# Patient Record
Sex: Female | Born: 1950 | Hispanic: No | Smoking: Never smoker
Health system: Southern US, Community
[De-identification: ages and names within clinical notes are randomized; demographics above are authoritative.]

## PROBLEM LIST (undated history)

## (undated) DIAGNOSIS — Z972 Presence of dental prosthetic device (complete) (partial): Secondary | ICD-10-CM

## (undated) DIAGNOSIS — E785 Hyperlipidemia, unspecified: Secondary | ICD-10-CM

## (undated) DIAGNOSIS — Z6841 Body Mass Index (BMI) 40.0 and over, adult: Secondary | ICD-10-CM

## (undated) DIAGNOSIS — G5603 Carpal tunnel syndrome, bilateral upper limbs: Secondary | ICD-10-CM

## (undated) DIAGNOSIS — K219 Gastro-esophageal reflux disease without esophagitis: Secondary | ICD-10-CM

## (undated) DIAGNOSIS — M1711 Unilateral primary osteoarthritis, right knee: Secondary | ICD-10-CM

## (undated) DIAGNOSIS — M170 Bilateral primary osteoarthritis of knee: Secondary | ICD-10-CM

## (undated) DIAGNOSIS — I10 Essential (primary) hypertension: Secondary | ICD-10-CM

## (undated) HISTORY — PX: TUBAL LIGATION: SHX77

## (undated) HISTORY — PX: BILATERAL CARPAL TUNNEL RELEASE: SHX6508

## (undated) HISTORY — PX: APPENDECTOMY: SHX54

---

## 2008-10-17 ENCOUNTER — Encounter: Payer: Self-pay | Admitting: Family Medicine

## 2008-10-18 ENCOUNTER — Encounter: Payer: Self-pay | Admitting: Family Medicine

## 2009-01-30 ENCOUNTER — Encounter: Payer: Self-pay | Admitting: Family Medicine

## 2009-02-15 ENCOUNTER — Encounter: Payer: Self-pay | Admitting: Family Medicine

## 2009-03-18 ENCOUNTER — Encounter: Payer: Self-pay | Admitting: Family Medicine

## 2010-03-30 ENCOUNTER — Encounter: Payer: Self-pay | Admitting: Family Medicine

## 2010-04-17 ENCOUNTER — Encounter: Payer: Self-pay | Admitting: Family Medicine

## 2010-05-18 ENCOUNTER — Encounter: Payer: Self-pay | Admitting: Family Medicine

## 2013-03-15 ENCOUNTER — Emergency Department: Payer: Self-pay | Admitting: Emergency Medicine

## 2013-06-11 ENCOUNTER — Emergency Department: Payer: Self-pay | Admitting: Internal Medicine

## 2016-03-31 ENCOUNTER — Other Ambulatory Visit: Payer: Self-pay | Admitting: Family Medicine

## 2016-03-31 DIAGNOSIS — M25561 Pain in right knee: Principal | ICD-10-CM

## 2016-03-31 DIAGNOSIS — G8929 Other chronic pain: Secondary | ICD-10-CM

## 2016-04-22 ENCOUNTER — Ambulatory Visit: Payer: Medicare Other

## 2016-04-22 ENCOUNTER — Other Ambulatory Visit: Payer: Self-pay | Admitting: Family Medicine

## 2016-04-22 DIAGNOSIS — E2839 Other primary ovarian failure: Secondary | ICD-10-CM

## 2016-05-04 ENCOUNTER — Ambulatory Visit
Admission: RE | Admit: 2016-05-04 | Discharge: 2016-05-04 | Disposition: A | Discharge status: Home / Self Care | Payer: Medicare Other | Source: Ambulatory Visit | Attending: Family Medicine | Admitting: Family Medicine

## 2016-05-04 DIAGNOSIS — M25561 Pain in right knee: Secondary | ICD-10-CM | POA: Insufficient documentation

## 2016-05-04 DIAGNOSIS — G8929 Other chronic pain: Secondary | ICD-10-CM | POA: Insufficient documentation

## 2016-09-30 ENCOUNTER — Ambulatory Visit (INDEPENDENT_AMBULATORY_CARE_PROVIDER_SITE_OTHER): Payer: Medicare Other | Admitting: Vascular Surgery

## 2016-09-30 ENCOUNTER — Encounter (INDEPENDENT_AMBULATORY_CARE_PROVIDER_SITE_OTHER): Payer: Self-pay | Admitting: Vascular Surgery

## 2016-09-30 DIAGNOSIS — M79604 Pain in right leg: Secondary | ICD-10-CM

## 2016-09-30 DIAGNOSIS — I83811 Varicose veins of right lower extremities with pain: Secondary | ICD-10-CM | POA: Diagnosis not present

## 2016-09-30 DIAGNOSIS — M79609 Pain in unspecified limb: Secondary | ICD-10-CM | POA: Insufficient documentation

## 2016-09-30 DIAGNOSIS — I83813 Varicose veins of bilateral lower extremities with pain: Secondary | ICD-10-CM

## 2016-09-30 DIAGNOSIS — M79605 Pain in left leg: Secondary | ICD-10-CM

## 2016-09-30 DIAGNOSIS — I872 Venous insufficiency (chronic) (peripheral): Secondary | ICD-10-CM

## 2016-09-30 NOTE — Progress Notes (Signed)
    MRN : [id number]  Rose Roberts is a 65 y.o. (06/23/1951) female who presents with chief complaint of  Chief Complaint  Patient presents with  . Varicose Veins    Right Leg laser ablation  .    The patient's right lower extremity was sterilely prepped and draped.  The ultrasound machine was used to visualize the right great saphenous vein throughout its course.  A segment of the great saphenous vein below the knee was selected for access.  The saphenous vein was accessed without difficulty using ultrasound guidance with a micropuncture needle.   An 0.018  wire was placed beyond the saphenofemoral junction through the sheath and the microneedle was removed.  The 65 cm sheath was then placed over the wire and the wire and dilator were removed.  The laser fiber was placed through the sheath and its tip was placed approximately 2 cm below the saphenofemoral junction.  Tumescent anesthesia was then created with a dilute lidocaine solution.  Laser energy was then delivered with constant withdrawal of the sheath and laser fiber.  Approximately 1274 Joules of energy were delivered over a length of 47 cm.  Sterile dressings were placed.  The patient tolerated the procedure well without complications.

## 2016-10-04 ENCOUNTER — Ambulatory Visit (INDEPENDENT_AMBULATORY_CARE_PROVIDER_SITE_OTHER): Payer: Medicare Other

## 2016-10-04 DIAGNOSIS — I83813 Varicose veins of bilateral lower extremities with pain: Secondary | ICD-10-CM

## 2016-10-25 ENCOUNTER — Encounter (INDEPENDENT_AMBULATORY_CARE_PROVIDER_SITE_OTHER): Payer: Self-pay | Admitting: Vascular Surgery

## 2016-10-25 ENCOUNTER — Ambulatory Visit (INDEPENDENT_AMBULATORY_CARE_PROVIDER_SITE_OTHER): Payer: Medicare Other | Admitting: Vascular Surgery

## 2016-10-25 VITALS — BP 157/79 | HR 80 | Resp 16 | Wt 186.0 lb

## 2016-10-25 DIAGNOSIS — I872 Venous insufficiency (chronic) (peripheral): Secondary | ICD-10-CM | POA: Diagnosis not present

## 2016-10-25 DIAGNOSIS — M79605 Pain in left leg: Secondary | ICD-10-CM | POA: Diagnosis not present

## 2016-10-25 DIAGNOSIS — M79604 Pain in right leg: Secondary | ICD-10-CM | POA: Diagnosis not present

## 2016-10-25 DIAGNOSIS — I83813 Varicose veins of bilateral lower extremities with pain: Secondary | ICD-10-CM

## 2016-10-25 NOTE — Progress Notes (Signed)
MRN : [id number]  Alvy BimlerBerta Defeo is a 66 y.o. (04/22/1951) female who presents with chief complaint of  Chief Complaint  Patient presents with  . Follow-up  .  History of Present Illness: The patient returns to the office for followup status post laser ablation of the Right great saphenous vein on 09/30/2016.  The patient note significant improvement in the lower extremity pain but not resolution of the symptoms. The patient notes multiple residual varicosities bilaterally which continued to hurt with dependent positions and remained tender to palpation. The patient's swelling is minimally from preoperative status. The patient continues to wear graduated compression stockings on a daily basis but these are not eliminating the pain and discomfort. The patient continues to use over-the-counter anti-inflammatory medications to treat the pain and related symptoms but this has not given the patient relief. The patient notes the pain in the lower extremities is causing problems with daily exercise, problems at work and even with household activities such as preparing meals and doing dishes.  The patient is otherwise done well and there have been no complications related to the laser procedure or interval changes in the patient's overall   Post laser ultrasound shows successful ablation of the right great saphenous vein. Ultrasound was dated 10/04/2016    Current Meds  Medication Sig  . hydrochlorothiazide (MICROZIDE) 12.5 MG capsule   . meloxicam (MOBIC) 15 MG tablet   . triamcinolone cream (KENALOG) 0.1 %     No past medical history on file.  No past surgical history on file.  Social History Social History  Substance Use Topics  . Smoking status: Never Smoker  . Smokeless tobacco: Never Used  . Alcohol use No    Family History No family history on file. No family history of bleeding/clotting disorders, porphyria or autoimmune disease   No Known Allergies   REVIEW OF SYSTEMS  (Negative unless checked)  Constitutional: [] Weight loss  [] Fever  [] Chills Cardiac: [] Chest pain   [] Chest pressure   [] Palpitations   [] Shortness of breath when laying flat   [] Shortness of breath with exertion. Vascular:  [] Pain in legs with walking   [x] Pain in legs with standing  [] History of DVT   [] Phlebitis   [x] Swelling in legs   [x] Varicose veins   [] Non-healing ulcers Pulmonary:   [] Uses home oxygen   [] Productive cough   [] Hemoptysis   [] Wheeze  [] COPD   [] Asthma Neurologic:  [] Dizziness   [] Seizures   [] History of stroke   [] History of TIA  [] Aphasia   [] Vissual changes   [] Weakness or numbness in arm   [] Weakness or numbness in leg Musculoskeletal:   [] Joint swelling   [] Joint pain   [] Low back pain Hematologic:  [] Easy bruising  [] Easy bleeding   [] Hypercoagulable state   [] Anemic Gastrointestinal:  [] Diarrhea   [] Vomiting  [] Gastroesophageal reflux/heartburn   [] Difficulty swallowing. Genitourinary:  [] Chronic kidney disease   [] Difficult urination  [] Frequent urination   [] Blood in urine Skin:  [] Rashes   [] Ulcers  Psychological:  [] History of anxiety   []  History of major depression.  Physical Examination  Vitals:   10/25/16 0908  BP: (!) 157/79  Pulse: 80  Resp: 16  Weight: 186 lb (84.4 kg)   Body mass index is 34.02 kg/m. Gen: WD/WN, NAD Head: [place]/AT, No temporalis wasting.  Ear/Nose/Throat: Hearing grossly intact, nares w/o erythema or drainage, poor dentition Eyes: PER, EOMI, sclera nonicteric.  Neck: Supple, no masses.  No bruit or JVD.  Pulmonary:  Good air  movement, clear to auscultation bilaterally, no use of accessory muscles.  Cardiac: RRR, normal S1, S2, no Murmurs. Vascular: Right leg with multiple diffuse large varicosities, varicose veins are proximally 7-10 mm in diameter. They are tender to palpation. There is 2+ edema bilaterally. There is mild to moderate venous stasis skin changes bilaterally right greater than left Vessel Right Left  Radial  Palpable Palpable  Ulnar Palpable Palpable  Brachial Palpable Palpable  Carotid Palpable Palpable  Femoral Palpable Palpable  Popliteal Palpable Palpable  PT Palpable Palpable  DP Palpable Palpable   Gastrointestinal: soft, non-distended. No guarding/no peritoneal signs.  Musculoskeletal: M/S 5/5 throughout.  No deformity or atrophy.  Neurologic: CN 2-12 intact. Pain and light touch intact in extremities.  Symmetrical.  Speech is fluent. Motor exam as listed above. Psychiatric: Judgment intact, Mood & affect appropriate for pt's clinical situation. Dermatologic: No rashes or ulcers noted.  No changes consistent with cellulitis. Lymph : No Cervical lymphadenopathy, no lichenification or skin changes of chronic lymphedema.  CBC No results found for: WBC, HGB, HCT, MCV, PLT  BMET No results found for: NA, K, CL, CO2, GLUCOSE, BUN, CREATININE, CALCIUM, GFRNONAA, GFRAA CrCl cannot be calculated (No order found.).  COAG No results found for: INR, PROTIME  Radiology No results found.   Assessment/Plan 1. Varicose veins of bilateral lower extremities with pain Recommend:  The patient has had successful ablation of the previously incompetent saphenous venous system but still has persistent symptoms of pain and swelling that are having a negative impact on daily life and daily activities.  Patient should undergo injection sclerotherapy to treat the residual varicosities.  The risks, benefits and alternative therapies were reviewed in detail with the patient.  All questions were answered.  The patient agrees to proceed with sclerotherapy at their convenience.  The patient will continue wearing the graduated compression stockings and using the over-the-counter pain medications to treat her symptoms.   2. Chronic venous insufficiency No surgery or intervention at this point in time.    I have had a long discussion with the patient regarding venous insufficiency and why it  causes  symptoms. I have discussed with the patient the chronic skin changes that accompany venous insufficiency and the long term sequela such as infection and ulceration.  Patient will begin wearing graduated compression stockings class 1 (20-30 mmHg) or compression wraps on a daily basis a prescription was given. The patient will put the stockings on first thing in the morning and removing them in the evening. The patient is instructed specifically not to sleep in the stockings.    In addition, behavioral modification including several periods of elevation of the lower extremities during the day will be continued. I have demonstrated that proper elevation is a position with the ankles at heart level.  The patient is instructed to begin routine exercise, especially walking on a daily basis  Following the review of the ultrasound the patient will follow up in 2-3 months to reassess the degree of swelling and the control that graduated compression stockings or compression wraps  is offering.     3. Pain in both lower extremities See  #1 and #2    Levora Dredge, MD  10/25/2016 12:02 PM

## 2016-11-18 ENCOUNTER — Encounter (INDEPENDENT_AMBULATORY_CARE_PROVIDER_SITE_OTHER): Payer: Self-pay | Admitting: Vascular Surgery

## 2016-11-18 ENCOUNTER — Ambulatory Visit (INDEPENDENT_AMBULATORY_CARE_PROVIDER_SITE_OTHER): Payer: Medicare Other | Admitting: Vascular Surgery

## 2016-11-18 VITALS — BP 159/88 | HR 72 | Resp 16 | Ht 62.0 in | Wt 184.0 lb

## 2016-11-18 DIAGNOSIS — I83813 Varicose veins of bilateral lower extremities with pain: Secondary | ICD-10-CM

## 2016-11-18 DIAGNOSIS — I83812 Varicose veins of left lower extremities with pain: Secondary | ICD-10-CM | POA: Diagnosis not present

## 2016-11-18 DIAGNOSIS — I872 Venous insufficiency (chronic) (peripheral): Secondary | ICD-10-CM

## 2016-11-18 DIAGNOSIS — M79605 Pain in left leg: Secondary | ICD-10-CM

## 2016-11-18 DIAGNOSIS — M79604 Pain in right leg: Secondary | ICD-10-CM

## 2016-11-18 NOTE — Progress Notes (Signed)
    MRN : [id number]  Rose Roberts is a 66 y.o. (07/18/1951) female who presents with chief complaint of  Chief Complaint  Patient presents with  . Varicose Veins    Left leg GSV laser ablation  .    The patient's left lower extremity was sterilely prepped and draped.  The ultrasound machine was used to visualize the great saphenous vein throughout its course.  A portion of the great saphenous vein in the mid thigh was selected for access.  The saphenous vein was accessed without difficulty using ultrasound guidance with a micropuncture needle.   An 0.018  wire was placed beyond the saphenofemoral junction through the sheath and the microneedle was removed.  The 65 cm sheath was then placed over the wire and the wire and dilator were removed.  The laser fiber was placed through the sheath and its tip was placed approximately 2 cm below the saphenofemoral junction.  Tumescent anesthesia was then created with a dilute lidocaine solution.  Laser energy was then delivered with constant withdrawal of the sheath and laser fiber.  Approximately 800 Joules of energy were delivered over a length of 18 cm.    The patient's leg was then repositioned. The anterior saphenous vein was then evaluated with ultrasound.   A segment in the mid thigh was selected for access.  The saphenous vein was accessed without difficulty using ultrasound guidance with a micropuncture needle.   An 0.018  wire was placed beyond the saphenofemoral junction through the sheath and the microneedle was removed.  The 65 cm sheath was then placed over the wire and the wire and dilator were removed.  The laser fiber was placed through the sheath and its tip was placed approximately 2 cm below the saphenofemoral junction.  Tumescent anesthesia was then created with a dilute lidocaine solution.  Laser energy was then delivered with constant withdrawal of the sheath and laser fiber.  Approximately 640 Joules of energy were delivered over a length  of 16 cm.    Sterile dressings were placed.  The patient tolerated the procedure well without complications.

## 2016-11-22 ENCOUNTER — Ambulatory Visit (INDEPENDENT_AMBULATORY_CARE_PROVIDER_SITE_OTHER): Payer: Medicare Other

## 2016-11-22 ENCOUNTER — Encounter (INDEPENDENT_AMBULATORY_CARE_PROVIDER_SITE_OTHER): Payer: Self-pay | Admitting: Vascular Surgery

## 2016-11-22 ENCOUNTER — Other Ambulatory Visit (INDEPENDENT_AMBULATORY_CARE_PROVIDER_SITE_OTHER): Payer: Self-pay | Admitting: Vascular Surgery

## 2016-11-22 ENCOUNTER — Ambulatory Visit (INDEPENDENT_AMBULATORY_CARE_PROVIDER_SITE_OTHER): Payer: Medicare Other | Admitting: Vascular Surgery

## 2016-11-22 VITALS — BP 148/80 | HR 98 | Resp 16 | Wt 183.0 lb

## 2016-11-22 DIAGNOSIS — I83892 Varicose veins of left lower extremities with other complications: Secondary | ICD-10-CM

## 2016-11-22 DIAGNOSIS — I83813 Varicose veins of bilateral lower extremities with pain: Secondary | ICD-10-CM

## 2016-11-22 DIAGNOSIS — I872 Venous insufficiency (chronic) (peripheral): Secondary | ICD-10-CM | POA: Diagnosis not present

## 2016-11-22 DIAGNOSIS — I82412 Acute embolism and thrombosis of left femoral vein: Secondary | ICD-10-CM | POA: Diagnosis not present

## 2016-11-22 DIAGNOSIS — I82409 Acute embolism and thrombosis of unspecified deep veins of unspecified lower extremity: Secondary | ICD-10-CM | POA: Insufficient documentation

## 2016-11-22 NOTE — Progress Notes (Signed)
MRN : [id number]  Rose BimlerBerta Roberts is a 66 y.o. (01/28/1951) female who presents with chief complaint of  Chief Complaint  Patient presents with  . Follow-up  .  History of Present Illness: The patient returns to the office for followup status post laser ablation of the left great saphenous vein on 11/18/2016.  The patient note significant improvement in the lower extremity pain but not resolution of the symptoms. The patient notes multiple residual varicosities bilaterally which continued to hurt with dependent positions and remained tender to palpation. The patient's swelling is minimally from preoperative status. The patient continues to wear graduated compression stockings on a daily basis but these are not eliminating the pain and discomfort. The patient continues to use over-the-counter anti-inflammatory medications to treat the pain and related symptoms but this has not given the patient relief. The patient notes the pain in the lower extremities is causing problems with daily exercise, problems at work and even with household activities such as preparing meals and doing dishes.  The patient is otherwise done well and there have been no complications related to the laser procedure or interval changes in the patient's overall   Post laser ultrasound shows successful ablation of the left great saphenous vein also there is thrombus extending into the common femoral artery   Current Meds  Medication Sig  . hydrochlorothiazide (MICROZIDE) 12.5 MG capsule   . meloxicam (MOBIC) 15 MG tablet   . triamcinolone cream (KENALOG) 0.1 %     No past medical history on file.  No past surgical history on file.  Social History Social History  Substance Use Topics  . Smoking status: Never Smoker  . Smokeless tobacco: Never Used  . Alcohol use No    Family History No family history on file. No family history of bleeding/clotting disorders, porphyria or autoimmune disease   No Known  Allergies   REVIEW OF SYSTEMS (Negative unless checked)  Constitutional: [] Weight loss  [] Fever  [] Chills Cardiac: [] Chest pain   [] Chest pressure   [] Palpitations   [] Shortness of breath when laying flat   [] Shortness of breath with exertion. Vascular:  [] Pain in legs with walking   [] Pain in legs at rest  [x]  DVT   [] Phlebitis   [x] Swelling in legs   [x] Varicose veins   [] Non-healing ulcers Pulmonary:   [] Uses home oxygen   [] Productive cough   [] Hemoptysis   [] Wheeze  [] COPD   [] Asthma Neurologic:  [] Dizziness   [] Seizures   [] History of stroke   [] History of TIA  [] Aphasia   [] Vissual changes   [] Weakness or numbness in arm   [] Weakness or numbness in leg Musculoskeletal:   [] Joint swelling   [] Joint pain   [] Low back pain Hematologic:  [] Easy bruising  [] Easy bleeding   [] Hypercoagulable state   [] Anemic Gastrointestinal:  [] Diarrhea   [] Vomiting  [] Gastroesophageal reflux/heartburn   [] Difficulty swallowing. Genitourinary:  [] Chronic kidney disease   [] Difficult urination  [] Frequent urination   [] Blood in urine Skin:  [] Rashes   [] Ulcers  Psychological:  [] History of anxiety   []  History of major depression.  Physical Examination  Vitals:   11/22/16 1158  BP: (!) 148/80  Pulse: 98  Resp: 16  Weight: 183 lb (83 kg)   Body mass index is 33.47 kg/m. Gen: WD/WN, NAD Head: /AT, No temporalis wasting.  Ear/Nose/Throat: Hearing grossly intact, nares w/o erythema or drainage, poor dentition Eyes: PER, EOMI, sclera nonicteric.  Neck: Supple, no masses.  No bruit or JVD.  Pulmonary:  Good air movement, clear to auscultation bilaterally, no use of accessory muscles.  Cardiac: RRR, normal S1, S2, no Murmurs. Vascular:  Large varicosities of the left leg minimal swelling Vessel Right Left  Radial Palpable Palpable  Ulnar Palpable Palpable  Brachial Palpable Palpable  Carotid Palpable Palpable  Femoral Palpable Palpable  Popliteal Palpable Palpable  PT Palpable Palpable  DP  Palpable Palpable   Gastrointestinal: soft, non-distended. No guarding/no peritoneal signs.  Musculoskeletal: M/S 5/5 throughout.  No deformity or atrophy.  Neurologic: CN 2-12 intact. Pain and light touch intact in extremities.  Symmetrical.  Speech is fluent. Motor exam as listed above. Psychiatric: Judgment intact, Mood & affect appropriate for pt's clinical situation. Dermatologic: No rashes or ulcers noted.  No changes consistent with cellulitis. Lymph : No Cervical lymphadenopathy, no lichenification or skin changes of chronic lymphedema.  CBC No results found for: WBC, HGB, HCT, MCV, PLT  BMET No results found for: NA, K, CL, CO2, GLUCOSE, BUN, CREATININE, CALCIUM, GFRNONAA, GFRAA CrCl cannot be calculated (No order found.).  COAG No results found for: INR, PROTIME  Radiology No results found.  Assessment/Plan 1. Acute deep vein thrombosis (DVT) of femoral vein of left lower extremity (HCC) Will start Eliquis and see back in 3 weeks with a duplex ultraosund - VAS [place] LOWER EXTREMITY VENOUS (DVT); Future  2. Chronic venous insufficiency No surgery or intervention at this point in time.    I have had a long discussion with the patient regarding venous insufficiency and why it  causes symptoms. I have discussed with the patient the chronic skin changes that accompany venous insufficiency and the long term sequela such as infection and ulceration.  Patient will begin wearing graduated compression stockings class 1 (20-30 mmHg) or compression wraps on a daily basis a prescription was given. The patient will put the stockings on first thing in the morning and removing them in the evening. The patient is instructed specifically not to sleep in the stockings.    In addition, behavioral modification including several periods of elevation of the lower extremities during the day will be continued. I have demonstrated that proper elevation is a position with the ankles at heart  level.  The patient is instructed to begin routine exercise, especially walking on a daily basis  Following the review of the ultrasound the patient will follow up in 2-3 weeks to reassess the degree of swelling and the control that graduated compression stockings or compression wraps  is offering.   The patient can be assessed for a Lymph Pump at that time  3. Varicose veins of bilateral lower extremities with pain Recommend:  The patient has had successful ablation of the previously incompetent saphenous venous system but still has persistent symptoms of pain and swelling that are having a negative impact on daily life and daily activities.  Patient should undergo injection sclerotherapy to treat the residual varicosities.  The risks, benefits and alternative therapies were reviewed in detail with the patient.  All questions were answered.  The patient agrees to proceed with sclerotherapy at their convenience.  The patient will continue wearing the graduated compression stockings and using the over-the-counter pain medications to treat her symptoms.     Levora Dredge, MD  11/22/2016 12:24 PM

## 2016-12-13 ENCOUNTER — Encounter (INDEPENDENT_AMBULATORY_CARE_PROVIDER_SITE_OTHER): Payer: Medicare Other

## 2016-12-13 ENCOUNTER — Ambulatory Visit (INDEPENDENT_AMBULATORY_CARE_PROVIDER_SITE_OTHER): Payer: Medicare Other | Admitting: Vascular Surgery

## 2016-12-20 ENCOUNTER — Other Ambulatory Visit (INDEPENDENT_AMBULATORY_CARE_PROVIDER_SITE_OTHER): Payer: Self-pay | Admitting: Vascular Surgery

## 2016-12-20 ENCOUNTER — Encounter (INDEPENDENT_AMBULATORY_CARE_PROVIDER_SITE_OTHER): Payer: Self-pay | Admitting: Vascular Surgery

## 2016-12-20 ENCOUNTER — Ambulatory Visit (INDEPENDENT_AMBULATORY_CARE_PROVIDER_SITE_OTHER): Payer: Medicare Other

## 2016-12-20 ENCOUNTER — Ambulatory Visit (INDEPENDENT_AMBULATORY_CARE_PROVIDER_SITE_OTHER): Payer: Medicare Other | Admitting: Vascular Surgery

## 2016-12-20 VITALS — BP 151/82 | HR 74 | Resp 16 | Wt 187.0 lb

## 2016-12-20 DIAGNOSIS — M79604 Pain in right leg: Secondary | ICD-10-CM | POA: Diagnosis not present

## 2016-12-20 DIAGNOSIS — I82412 Acute embolism and thrombosis of left femoral vein: Secondary | ICD-10-CM | POA: Diagnosis not present

## 2016-12-20 DIAGNOSIS — I83813 Varicose veins of bilateral lower extremities with pain: Secondary | ICD-10-CM

## 2016-12-20 DIAGNOSIS — M79605 Pain in left leg: Secondary | ICD-10-CM | POA: Diagnosis not present

## 2016-12-20 DIAGNOSIS — I872 Venous insufficiency (chronic) (peripheral): Secondary | ICD-10-CM

## 2016-12-21 NOTE — Progress Notes (Signed)
MRN : [id number]  Rose Roberts is a 66 y.o. (July 30, 1951) female who presents with chief complaint of  Chief Complaint  Patient presents with  . Follow-up  .  History of Present Illness: The patient returns to the office for followup status post laser ablation of the left saphenous vein.  The patient note significant improvement in the lower extremity pain but not resolution of the symptoms. The patient notes multiple residual varicosities bilaterally which continued to hurt with dependent positions and remained tender to palpation. The patient's swelling is minimally from preoperative status. The patient continues to wear graduated compression stockings on a daily basis but these are not eliminating the pain and discomfort. The patient continues to use over-the-counter anti-inflammatory medications to treat the pain and related symptoms but this has not given the patient relief. The patient notes the pain in the lower extremities is causing problems with daily exercise, problems at work and even with household activities such as preparing meals and doing dishes.  The patient is otherwise done well and there have been no complications related to the laser procedure or interval changes in the patient's overall   Post laser ultrasound shows successful ablation of the left great saphenous vein   Current Meds  Medication Sig  . hydrochlorothiazide (MICROZIDE) 12.5 MG capsule   . triamcinolone cream (KENALOG) 0.1 %     No past medical history on file.  No past surgical history on file.  Social History Social History  Substance Use Topics  . Smoking status: Never Smoker  . Smokeless tobacco: Never Used  . Alcohol use No    Family History No family history on file. No family history of bleeding/clotting disorders, porphyria or autoimmune disease  No Known Allergies   REVIEW OF SYSTEMS (Negative unless checked)  Constitutional: [] Weight loss  [] Fever  [] Chills Cardiac: [] Chest  pain   [] Chest pressure   [] Palpitations   [] Shortness of breath when laying flat   [] Shortness of breath with exertion. Vascular:  [] Pain in legs with walking   [x] Pain in legs with standing  [] History of DVT   [] Phlebitis   [x] Swelling in legs   [x] Varicose veins   [] Non-healing ulcers Pulmonary:   [] Uses home oxygen   [] Productive cough   [] Hemoptysis   [] Wheeze  [] COPD   [] Asthma Neurologic:  [] Dizziness   [] Seizures   [] History of stroke   [] History of TIA  [] Aphasia   [] Vissual changes   [] Weakness or numbness in arm   [] Weakness or numbness in leg Musculoskeletal:   [] Joint swelling   [] Joint pain   [] Low back pain Hematologic:  [] Easy bruising  [] Easy bleeding   [] Hypercoagulable state   [] Anemic Gastrointestinal:  [] Diarrhea   [] Vomiting  [] Gastroesophageal reflux/heartburn   [] Difficulty swallowing. Genitourinary:  [] Chronic kidney disease   [] Difficult urination  [] Frequent urination   [] Blood in urine Skin:  [] Rashes   [] Ulcers  Psychological:  [] History of anxiety   []  History of major depression.  Physical Examination  Vitals:   12/20/16 0926  BP: (!) 151/82  Pulse: 74  Resp: 16  Weight: 84.8 kg (187 lb)   Body mass index is 34.2 kg/m. Gen: WD/WN, NAD Head: [place]/AT, No temporalis wasting.  Ear/Nose/Throat: Hearing grossly intact, nares w/o erythema or drainage, poor dentition Eyes: PER, EOMI, sclera nonicteric.  Neck: Supple, no masses.  No bruit or JVD.  Pulmonary:  Good air movement, clear to auscultation bilaterally, no use of accessory muscles.  Cardiac: RRR, normal S1, S2, no  Murmurs. Vascular:  Large varicose veins left leg tender to palpation >10 mm, moderate venous stasis changes Vessel Right Left  Radial Palpable Palpable  Ulnar Palpable Palpable  Brachial Palpable Palpable  Carotid Palpable Palpable  Femoral Palpable Palpable  Popliteal Palpable Palpable  PT Palpable Palpable  DP Palpable Palpable  Gastrointestinal: soft, non-distended. No guarding/no  peritoneal signs.  Musculoskeletal: M/S 5/5 throughout.  No deformity or atrophy.  Neurologic: CN 2-12 intact. Pain and light touch intact in extremities.  Symmetrical.  Speech is fluent. Motor exam as listed above. Psychiatric: Judgment intact, Mood & affect appropriate for pt's clinical situation. Dermatologic: No rashes or ulcers noted.  No changes consistent with cellulitis. Lymph : No Cervical lymphadenopathy, no lichenification or skin changes of chronic lymphedema.  CBC No results found for: WBC, HGB, HCT, MCV, PLT  BMET No results found for: NA, K, CL, CO2, GLUCOSE, BUN, CREATININE, CALCIUM, GFRNONAA, GFRAA CrCl cannot be calculated (No order found.).  COAG No results found for: INR, PROTIME  Radiology No results found.  Assessment/Plan 1. Varicose veins of bilateral lower extremities with pain Recommend:  The patient has had successful ablation of the previously incompetent saphenous venous system but still has persistent symptoms of pain and swelling that are having a negative impact on daily life and daily activities.  Patient should undergo injection sclerotherapy to treat the residual varicosities.  The risks, benefits and alternative therapies were reviewed in detail with the patient.  All questions were answered.  The patient agrees to proceed with sclerotherapy at their convenience.  The patient will continue wearing the graduated compression stockings and using the over-the-counter pain medications to treat her symptoms.     2. Acute deep vein thrombosis (DVT) of femoral vein of left lower extremity (HCC) No evidence of DVT on the study dated 12/20/2016  DC Eliquis  3. Chronic venous insufficiency See #1  4. Pain in both lower extremities Will move forward with sclerotherapy    Levora DredgeGregory Meridian Scherger, MD  12/21/2016 8:18 AM

## 2017-03-08 ENCOUNTER — Other Ambulatory Visit: Payer: Self-pay | Admitting: Family Medicine

## 2017-03-10 ENCOUNTER — Ambulatory Visit (INDEPENDENT_AMBULATORY_CARE_PROVIDER_SITE_OTHER): Payer: Medicare Other | Admitting: Vascular Surgery

## 2017-03-10 ENCOUNTER — Encounter (INDEPENDENT_AMBULATORY_CARE_PROVIDER_SITE_OTHER): Payer: Self-pay | Admitting: Vascular Surgery

## 2017-03-10 VITALS — BP 158/91 | HR 94 | Resp 15 | Ht 63.5 in | Wt 186.0 lb

## 2017-03-10 DIAGNOSIS — I83813 Varicose veins of bilateral lower extremities with pain: Secondary | ICD-10-CM

## 2017-03-10 DIAGNOSIS — M79605 Pain in left leg: Secondary | ICD-10-CM | POA: Diagnosis not present

## 2017-03-10 DIAGNOSIS — M79604 Pain in right leg: Secondary | ICD-10-CM | POA: Diagnosis not present

## 2017-03-10 DIAGNOSIS — I872 Venous insufficiency (chronic) (peripheral): Secondary | ICD-10-CM | POA: Diagnosis not present

## 2017-03-10 NOTE — Progress Notes (Signed)
MRN : [id number]  Rose Roberts is a 66 y.o. (12/08/1950) female who presents with chief complaint of  Chief Complaint  Patient presents with  . Re-evaluation    Post laser follow up  .  History of Present Illness: The patient returns to the office for followup status post laser ablation of the Rt and Lt GSV saphenous vein on 09/30/2016 and 11/18/2016 respectively.  The patient note significant improvement in the lower extremity pain but not resolution of the symptoms. The patient notes multiple residual varicosities bilaterally which continued to hurt with dependent positions and remained tender to palpation. The patient's swelling is minimally from preoperative status. The patient continues to wear graduated compression stockings on a daily basis but these are not eliminating the pain and discomfort. The patient continues to use over-the-counter anti-inflammatory medications to treat the pain and related symptoms but this has not given the patient relief. The patient notes the pain in the lower extremities is causing problems with daily exercise, problems at work and even with household activities such as preparing meals and doing dishes.  The patient is otherwise done well and there have been no complications related to the laser procedure or interval changes in the patient's overall   Post laser ultrasound shows successful ablation of the bilateral GSV with resolution of the post laser DVT   Current Meds  Medication Sig  . ALPRAZolam (XANAX) 0.5 MG tablet   . atorvastatin (LIPITOR) 20 MG tablet   . hydrochlorothiazide (MICROZIDE) 12.5 MG capsule   . meloxicam (MOBIC) 15 MG tablet   . triamcinolone cream (KENALOG) 0.1 %     No past medical history on file.  No past surgical history on file.  Social History Social History  Substance Use Topics  . Smoking status: Never Smoker  . Smokeless tobacco: Never Used  . Alcohol use No    Family History No family history on file.  No  Known Allergies   REVIEW OF SYSTEMS (Negative unless checked)  Constitutional: [] Weight loss  [] Fever  [] Chills Cardiac: [] Chest pain   [] Chest pressure   [] Palpitations   [] Shortness of breath when laying flat   [] Shortness of breath with exertion. Vascular:  [] Pain in legs with walking   [x] Pain in legs with standing  [] History of DVT   [] Phlebitis   [x] Swelling in legs   [x] Varicose veins   [] Non-healing ulcers Pulmonary:   [] Uses home oxygen   [] Productive cough   [] Hemoptysis   [] Wheeze  [] COPD   [] Asthma Neurologic:  [] Dizziness   [] Seizures   [] History of stroke   [] History of TIA  [] Aphasia   [] Vissual changes   [] Weakness or numbness in arm   [] Weakness or numbness in leg Musculoskeletal:   [] Joint swelling   [] Joint pain   [] Low back pain Hematologic:  [] Easy bruising  [] Easy bleeding   [] Hypercoagulable state   [] Anemic Gastrointestinal:  [] Diarrhea   [] Vomiting  [] Gastroesophageal reflux/heartburn   [] Difficulty swallowing. Genitourinary:  [] Chronic kidney disease   [] Difficult urination  [] Frequent urination   [] Blood in urine Skin:  [] Rashes   [] Ulcers  Psychological:  [] History of anxiety   []  History of major depression.  Physical Examination  Vitals:   03/10/17 1554  BP: (!) 158/91  Pulse: 94  Resp: 15  Weight: 186 lb (84.4 kg)  Height: 5' 3.5" (1.613 m)   Body mass index is 32.43 kg/m. Gen: WD/WN, NAD Head: [place]/AT, No temporalis wasting.  Ear/Nose/Throat: Hearing grossly intact, nares w/o erythema or  drainage Eyes: PER, EOMI, sclera nonicteric.  Neck: Supple, no large masses.   Pulmonary:  Good air movement, no audible wheezing bilaterally, no use of accessory muscles.  Cardiac: RRR, no JVD Vascular: Large varicosities present extensively greater than 10 mm bilaterally.  Mild venous stasis changes to the legs bilaterally.  2+ soft pitting edema Vessel Right Left  PT Palpable Palpable  DP Palpable Palpable  Gastrointestinal: Non-distended. No guarding/no  peritoneal signs.  Musculoskeletal: M/S 5/5 throughout.  No deformity or atrophy.  Neurologic: CN 2-12 intact. Symmetrical.  Speech is fluent. Motor exam as listed above. Psychiatric: Judgment intact, Mood & affect appropriate for pt's clinical situation. Dermatologic: No rashes or ulcers noted.  No changes consistent with cellulitis. Lymph : No lichenification or skin changes of chronic lymphedema.  CBC No results found for: WBC, HGB, HCT, MCV, PLT  BMET No results found for: NA, K, CL, CO2, GLUCOSE, BUN, CREATININE, CALCIUM, GFRNONAA, GFRAA CrCl cannot be calculated (No order found.).  COAG No results found for: INR, PROTIME  Radiology No results found.  Assessment/Plan 1. Varicose veins of bilateral lower extremities with pain Recommend:  The patient has had successful ablation of the previously incompetent saphenous venous system but still has persistent symptoms of pain and swelling that are having a negative impact on daily life and daily activities.  Patient should undergo injection sclerotherapy to treat the residual varicosities.  The risks, benefits and alternative therapies were reviewed in detail with the patient.  All questions were answered.  The patient agrees to proceed with sclerotherapy at their convenience.  The patient will continue wearing the graduated compression stockings and using the over-the-counter pain medications to treat her symptoms.    2. Chronic venous insufficiency No surgery or intervention at this point in time.    I have had a long discussion with the patient regarding venous insufficiency and why it  causes symptoms. I have discussed with the patient the chronic skin changes that accompany venous insufficiency and the long term sequela such as infection and ulceration.  Patient will begin wearing graduated compression stockings class 1 (20-30 mmHg) or compression wraps on a daily basis a prescription was given. The patient will put the  stockings on first thing in the morning and removing them in the evening. The patient is instructed specifically not to sleep in the stockings.    In addition, behavioral modification including several periods of elevation of the lower extremities during the day will be continued. I have demonstrated that proper elevation is a position with the ankles at heart level.  The patient is instructed to begin routine exercise, especially walking on a daily basis  Following the review of the ultrasound the patient will follow up in 2-3 months to reassess the degree of swelling and the control that graduated compression stockings or compression wraps  is offering.   T   3. Pain in both lower extremities See #1&2   Levora DredgeGregory Kanisha Duba, MD  03/10/2017 4:25 PM

## 2017-04-18 ENCOUNTER — Ambulatory Visit (INDEPENDENT_AMBULATORY_CARE_PROVIDER_SITE_OTHER): Payer: Medicare Other | Admitting: Vascular Surgery

## 2017-04-18 ENCOUNTER — Encounter (INDEPENDENT_AMBULATORY_CARE_PROVIDER_SITE_OTHER): Payer: Self-pay | Admitting: Vascular Surgery

## 2017-04-18 VITALS — BP 158/88 | HR 96 | Resp 16 | Wt 187.8 lb

## 2017-04-18 DIAGNOSIS — I83811 Varicose veins of right lower extremities with pain: Secondary | ICD-10-CM

## 2017-04-18 DIAGNOSIS — I83812 Varicose veins of left lower extremities with pain: Secondary | ICD-10-CM

## 2017-04-18 DIAGNOSIS — I83813 Varicose veins of bilateral lower extremities with pain: Secondary | ICD-10-CM

## 2017-04-19 NOTE — Progress Notes (Signed)
    MRN : [id number]  Rose Roberts is a 66 y.o. (04/10/1951) female who presents with chief complaint of No chief complaint on file. .   Procedure:  Sclerotherapy using hypertonic saline mixed with 1% Lidocaine was performed on lower extremities bilateral.  Compression wraps were placed.  The patient tolerated the procedure well.  Plan:  Follow up as arranged

## 2017-05-12 ENCOUNTER — Ambulatory Visit (INDEPENDENT_AMBULATORY_CARE_PROVIDER_SITE_OTHER): Payer: Medicare Other | Admitting: Vascular Surgery

## 2017-06-15 ENCOUNTER — Other Ambulatory Visit: Payer: Self-pay | Admitting: Family Medicine

## 2017-06-15 DIAGNOSIS — Z1239 Encounter for other screening for malignant neoplasm of breast: Secondary | ICD-10-CM

## 2017-06-30 ENCOUNTER — Encounter (INDEPENDENT_AMBULATORY_CARE_PROVIDER_SITE_OTHER): Payer: Self-pay | Admitting: Vascular Surgery

## 2017-06-30 ENCOUNTER — Ambulatory Visit (INDEPENDENT_AMBULATORY_CARE_PROVIDER_SITE_OTHER): Payer: Medicare Other | Admitting: Vascular Surgery

## 2017-06-30 VITALS — BP 138/81 | HR 88 | Resp 15 | Ht 64.5 in | Wt 186.0 lb

## 2017-06-30 DIAGNOSIS — I83811 Varicose veins of right lower extremities with pain: Secondary | ICD-10-CM

## 2017-06-30 DIAGNOSIS — I872 Venous insufficiency (chronic) (peripheral): Secondary | ICD-10-CM

## 2017-06-30 DIAGNOSIS — I83812 Varicose veins of left lower extremities with pain: Secondary | ICD-10-CM | POA: Diagnosis not present

## 2017-06-30 DIAGNOSIS — I83813 Varicose veins of bilateral lower extremities with pain: Secondary | ICD-10-CM

## 2017-06-30 NOTE — Progress Notes (Signed)
   Indication:  Patient presents with symptomatic varicose veins of the bilateral lower extremity.  Ms [name] assisting  Procedure:  Sclerotherapy using hypertonic saline mixed with 1% Lidocaine was performed on the bilateral lower extremity.  Compression wraps were placed.  The patient tolerated the procedure well.  Plan:  Follow up as needed.

## 2017-08-11 ENCOUNTER — Encounter (INDEPENDENT_AMBULATORY_CARE_PROVIDER_SITE_OTHER): Payer: Self-pay | Admitting: Vascular Surgery

## 2017-08-11 ENCOUNTER — Ambulatory Visit (INDEPENDENT_AMBULATORY_CARE_PROVIDER_SITE_OTHER): Payer: Medicare Other | Admitting: Vascular Surgery

## 2017-08-11 VITALS — BP 155/86 | HR 88 | Resp 16 | Ht 59.0 in | Wt 185.0 lb

## 2017-08-11 DIAGNOSIS — I83813 Varicose veins of bilateral lower extremities with pain: Secondary | ICD-10-CM

## 2017-08-11 DIAGNOSIS — I83812 Varicose veins of left lower extremities with pain: Secondary | ICD-10-CM | POA: Diagnosis not present

## 2017-08-11 DIAGNOSIS — I83811 Varicose veins of right lower extremities with pain: Secondary | ICD-10-CM | POA: Diagnosis not present

## 2017-08-11 NOTE — Progress Notes (Signed)
    MRN : [id number]  Rose BimlerBerta Roberts is a 66 y.o. (07/12/1951) female who presents with chief complaint of  Chief Complaint  Patient presents with  . Follow-up    sclero therapy  .   Procedure:  Sclerotherapy using hypertonic saline mixed with 1% Lidocaine was performed on lower extremities bilateral.  Compression wraps were placed.  The patient tolerated the procedure well.  Plan:  Follow up as arranged

## 2018-09-26 ENCOUNTER — Other Ambulatory Visit: Payer: Self-pay | Admitting: Family Medicine

## 2018-09-26 DIAGNOSIS — Z1231 Encounter for screening mammogram for malignant neoplasm of breast: Secondary | ICD-10-CM

## 2018-10-26 ENCOUNTER — Encounter: Payer: Self-pay | Admitting: Radiology

## 2018-10-26 ENCOUNTER — Ambulatory Visit
Admission: RE | Admit: 2018-10-26 | Discharge: 2018-10-26 | Disposition: A | Discharge status: Home / Self Care | Payer: Medicare Other | Source: Ambulatory Visit | Attending: Family Medicine | Admitting: Family Medicine

## 2018-10-26 DIAGNOSIS — Z1231 Encounter for screening mammogram for malignant neoplasm of breast: Secondary | ICD-10-CM | POA: Insufficient documentation

## 2018-11-07 ENCOUNTER — Other Ambulatory Visit: Payer: Self-pay | Admitting: Family Medicine

## 2018-11-07 DIAGNOSIS — N6320 Unspecified lump in the left breast, unspecified quadrant: Secondary | ICD-10-CM

## 2018-11-07 DIAGNOSIS — R928 Other abnormal and inconclusive findings on diagnostic imaging of breast: Secondary | ICD-10-CM

## 2018-11-13 ENCOUNTER — Ambulatory Visit
Admission: RE | Admit: 2018-11-13 | Discharge: 2018-11-13 | Disposition: A | Discharge status: Home / Self Care | Payer: Medicare Other | Source: Ambulatory Visit | Attending: Family Medicine | Admitting: Family Medicine

## 2018-11-13 DIAGNOSIS — N6320 Unspecified lump in the left breast, unspecified quadrant: Secondary | ICD-10-CM

## 2018-11-13 DIAGNOSIS — R928 Other abnormal and inconclusive findings on diagnostic imaging of breast: Secondary | ICD-10-CM

## 2018-11-14 ENCOUNTER — Encounter
Admission: RE | Admit: 2018-11-14 | Discharge: 2018-11-14 | Disposition: A | Discharge status: Home / Self Care | Payer: Medicare Other | Source: Ambulatory Visit | Attending: Surgery | Admitting: Surgery

## 2018-11-14 ENCOUNTER — Other Ambulatory Visit: Payer: Self-pay

## 2018-11-14 DIAGNOSIS — Z01818 Encounter for other preprocedural examination: Secondary | ICD-10-CM | POA: Diagnosis not present

## 2018-11-14 HISTORY — DX: Hyperlipidemia, unspecified: E78.5

## 2018-11-14 HISTORY — DX: Unilateral primary osteoarthritis, right knee: M17.11

## 2018-11-14 HISTORY — DX: Essential (primary) hypertension: I10

## 2018-11-14 LAB — CBC
HEMATOCRIT: 42.4 % (ref 36.0–46.0)
Hemoglobin: 13.5 g/dL (ref 12.0–15.0)
MCH: 26.7 pg (ref 26.0–34.0)
MCHC: 31.8 g/dL (ref 30.0–36.0)
MCV: 83.8 fL (ref 80.0–100.0)
Platelets: 258 10*3/uL (ref 150–400)
RBC: 5.06 MIL/uL (ref 3.87–5.11)
RDW: 15.2 % (ref 11.5–15.5)
WBC: 10.5 10*3/uL (ref 4.0–10.5)
nRBC: 0 % (ref 0.0–0.2)

## 2018-11-14 LAB — URINALYSIS, ROUTINE W REFLEX MICROSCOPIC
Bilirubin Urine: NEGATIVE
Glucose, UA: NEGATIVE mg/dL
Hgb urine dipstick: NEGATIVE
Ketones, ur: NEGATIVE mg/dL
Leukocytes, UA: NEGATIVE
Nitrite: NEGATIVE
Protein, ur: NEGATIVE mg/dL
Specific Gravity, Urine: 1.019 (ref 1.005–1.030)
pH: 5 (ref 5.0–8.0)

## 2018-11-14 LAB — SURGICAL PCR SCREEN
MRSA, PCR: NEGATIVE
Staphylococcus aureus: NEGATIVE

## 2018-11-14 LAB — BASIC METABOLIC PANEL
Anion gap: 5 (ref 5–15)
BUN: 14 mg/dL (ref 8–23)
CO2: 29 mmol/L (ref 22–32)
CREATININE: 0.65 mg/dL (ref 0.44–1.00)
Calcium: 9.2 mg/dL (ref 8.9–10.3)
Chloride: 106 mmol/L (ref 98–111)
GFR calc Af Amer: >60 mL/min (ref >60)
GFR calc non Af Amer: >60 mL/min (ref >60)
Glucose, Bld: 86 mg/dL (ref 70–99)
Potassium: 3.9 mmol/L (ref 3.5–5.1)
Sodium: 140 mmol/L (ref 135–145)

## 2018-11-14 LAB — TYPE AND SCREEN
ABO/RH(D): A POS
ANTIBODY SCREEN: NEGATIVE

## 2018-11-14 NOTE — Patient Instructions (Addendum)
Your procedure is scheduled on: Thursday, February 6 Su procedimiento est programado para: Report to .day surgery unit on second floor of [facility] Presntese a: To find out your arrival time please call [phone] between 1PM - 3PM on Wednesday, February 5 Para saber su hora de llegada por favor llame al [phone] entre la 1PM - 3PM el da:   Remember: Instructions that are not followed completely may result in serious medical risk, up to and including death,  or upon the discretion of your surgeon and anesthesiologist your surgery may need to be rescheduled.  Recuerde: Las instrucciones que no se siguen completamente Armed forces logistics/support/administrative officer en un riesgo de salud grave, incluyendo hasta  la [place] o a discrecin de su cirujano y Scientific laboratory technician, su ciruga se puede posponer.   .Do not eat food after midnight the night before your procedure. No    gum chewing or hard candies. You may drink clear liquids up to 2 hours     before you are scheduled to arrive for your surgery- DO not drink clear     Liquids within 2 hours of the start of your surgery.     Clear Liquids include:    water, apple juice without pulp, clear carbohydrate drink such as    Clearfast of [place], Black Coffee or Tea (Do not add anything to coffee or tea).      No coma nada despus de la medianoche de la noche anterior a su    procedimiento. No coma chicles ni caramelos duros. Puede tomar    lquidos claros hasta 2 horas antes de su hora programada de llegada al     hospital para su procedimiento. No tome lquidos claros durante el     transcurso de las 2 horas de su llegada programada al hospital para su     procedimiento, ya que esto puede llevar a que su procedimiento se    retrase o tenga que volver a Magazine features editor.  Los lquidos claros incluyen:          - Agua o jugo de [place] sin pulpa          - Bebidas claras con carbohidratos como ClearFast o Gatorade          - Caf negro o t claro (sin leche, sin  cremas, no agregue nada al caf ni al t)  No tome nada que no est en esta lista.  Los pacientes con diabetes tipo 1 y tipo 2 solo deben Printmaker.  Llame a la clnica de PreCare o a la unidad de Same Day Surgery si  tiene alguna pregunta sobre estas instrucciones.     No alcohol for 24 hours before or after surgery.    No tome alcohol durante las 24 horas antes ni despus de la [place].    Notify your doctor if there is any change in your medical condition (cold,fever, infections).    Informe a su mdico si hay algn cambio en su condicin mdica  (resfriado, fiebre, infecciones).   Do not wear jewelry, make-up, hairpins, clips or nail polish.  No use joyas, maquillajes, pinzas/ganchos para el cabello ni esmalte de uas.  Do not wear lotions, powders, or perfumes. You may wear deodorant.  No use lociones, polvos o perfumes.  Puede usar desodorante.    Do not shave 48 hours prior to surgery. Men may shave face and neck.  No se afeite 48 horas antes de la [place].  Los hombres pueden [employer] cara  y  el cuello.   Do not bring valuables to the hospital.   No lleve objetos de valor al hospital.  [facility] is not responsible for any belongings or valuables.  Minto no se hace responsable de ningn tipo de pertenencias u objetos de Licensed conveyancer.               Contacts, dentures or bridgework may not be worn into surgery.  Los lentes de [place], las dentaduras postizas o puentes no se pueden usar en la [place].   Leave your suitcase in the car. After surgery it may be brought to your room.  Deje su maleta en el auto.  Despus de la ciruga podr traerla a su habitacin.   For patients admitted to the hospital, discharge time is determined by your  treatment team.  Para los pacientes que sean ingresados al hospital, el tiempo en el cual se le  dar de alta es determinado por su equipo de [place].  Take these medicines the morning of surgery with A SIP OF WATER: NONE           Tome estas medicinas la maana de la ciruga con UN SORBO DE AGUA:     Use CHG Soap as directed          Utilice el jabn de CHG segn lo indicado  STARTING Thursday, January 30, Stop MELOXICAM, ADVIL, ALEVE, BC POWDER, OR ANY OVER THE COUNTER SUPPLEMENTS.          Deje de tomar Doctor, general practice:

## 2018-11-15 LAB — URINE CULTURE: Culture: NO GROWTH

## 2018-11-22 MED ORDER — CEFAZOLIN SODIUM-DEXTROSE 2-4 GM/100ML-% IV SOLN
2.0000 g | Freq: Once | INTRAVENOUS | Status: AC
  Administered 2018-11-23: 2 g via INTRAVENOUS

## 2018-11-23 ENCOUNTER — Encounter: Payer: Self-pay | Admitting: *Deleted

## 2018-11-23 ENCOUNTER — Inpatient Hospital Stay: Payer: Medicare Other

## 2018-11-23 ENCOUNTER — Inpatient Hospital Stay
Admission: RE | Admit: 2018-11-23 | Discharge: 2018-11-26 | DRG: 470 | Disposition: A | Discharge status: Home Health | Payer: Medicare Other | Attending: Surgery | Admitting: Surgery

## 2018-11-23 ENCOUNTER — Encounter
Admission: RE | Disposition: A | Discharge status: Home Health | Payer: Self-pay | Source: Home / Self Care | Attending: Surgery

## 2018-11-23 ENCOUNTER — Other Ambulatory Visit: Payer: Self-pay

## 2018-11-23 DIAGNOSIS — Z96659 Presence of unspecified artificial knee joint: Secondary | ICD-10-CM

## 2018-11-23 DIAGNOSIS — M1711 Unilateral primary osteoarthritis, right knee: Principal | ICD-10-CM | POA: Diagnosis present

## 2018-11-23 DIAGNOSIS — E785 Hyperlipidemia, unspecified: Secondary | ICD-10-CM | POA: Diagnosis present

## 2018-11-23 DIAGNOSIS — Z96651 Presence of right artificial knee joint: Secondary | ICD-10-CM

## 2018-11-23 DIAGNOSIS — I10 Essential (primary) hypertension: Secondary | ICD-10-CM | POA: Diagnosis present

## 2018-11-23 DIAGNOSIS — Z79899 Other long term (current) drug therapy: Secondary | ICD-10-CM

## 2018-11-23 DIAGNOSIS — M25561 Pain in right knee: Secondary | ICD-10-CM | POA: Diagnosis present

## 2018-11-23 DIAGNOSIS — M7989 Other specified soft tissue disorders: Secondary | ICD-10-CM

## 2018-11-23 HISTORY — PX: TOTAL KNEE ARTHROPLASTY: SHX125

## 2018-11-23 LAB — ABO/RH: ABO/RH(D): A POS

## 2018-11-23 SURGERY — ARTHROPLASTY, KNEE, TOTAL
Anesthesia: General | Laterality: Right

## 2018-11-23 MED ORDER — FENTANYL CITRATE (PF) 100 MCG/2ML IJ SOLN
INTRAMUSCULAR | Status: AC
  Administered 2018-11-23: 25 ug via INTRAVENOUS
  Filled 2018-11-23: qty 2

## 2018-11-23 MED ORDER — FENTANYL CITRATE (PF) 250 MCG/5ML IJ SOLN
INTRAMUSCULAR | Status: AC
  Filled 2018-11-23: qty 5

## 2018-11-23 MED ORDER — SODIUM CHLORIDE 0.9 % IV SOLN
INTRAVENOUS | Status: DC
  Administered 2018-11-23: 19:00:00 via INTRAVENOUS

## 2018-11-23 MED ORDER — TRANEXAMIC ACID 1000 MG/10ML IV SOLN
INTRAVENOUS | Status: DC | PRN
  Administered 2018-11-23: 1000 mg via INTRAVENOUS

## 2018-11-23 MED ORDER — KETOROLAC TROMETHAMINE 30 MG/ML IJ SOLN
INTRAMUSCULAR | Status: AC
  Administered 2018-11-23: 30 mg via INTRAVENOUS
  Filled 2018-11-23: qty 1

## 2018-11-23 MED ORDER — ONDANSETRON HCL 4 MG PO TABS: 4.0000 mg | ORAL_TABLET | Freq: Four times a day (QID) | ORAL | Status: DC | PRN

## 2018-11-23 MED ORDER — PANTOPRAZOLE SODIUM 40 MG PO TBEC
40.0000 mg | DELAYED_RELEASE_TABLET | Freq: Every day | ORAL | Status: DC
  Administered 2018-11-24 – 2018-11-26 (×3): 40 mg via ORAL
  Filled 2018-11-23 (×3): qty 1

## 2018-11-23 MED ORDER — OXYCODONE HCL 5 MG PO TABS
5.0000 mg | ORAL_TABLET | Freq: Once | ORAL | Status: AC | PRN
  Administered 2018-11-23: 5 mg via ORAL

## 2018-11-23 MED ORDER — FENTANYL CITRATE (PF) 100 MCG/2ML IJ SOLN
INTRAMUSCULAR | Status: AC
  Administered 2018-11-23: 50 ug via INTRAVENOUS
  Filled 2018-11-23: qty 2

## 2018-11-23 MED ORDER — ACETAMINOPHEN 10 MG/ML IV SOLN
INTRAVENOUS | Status: DC | PRN
  Administered 2018-11-23: 1000 mg via INTRAVENOUS

## 2018-11-23 MED ORDER — ENOXAPARIN SODIUM 40 MG/0.4ML ~~LOC~~ SOLN
40.0000 mg | SUBCUTANEOUS | Status: DC
  Administered 2018-11-24 – 2018-11-26 (×3): 40 mg via SUBCUTANEOUS
  Filled 2018-11-23 (×3): qty 0.4

## 2018-11-23 MED ORDER — ONDANSETRON HCL 4 MG/2ML IJ SOLN
INTRAMUSCULAR | Status: AC
  Filled 2018-11-23: qty 2

## 2018-11-23 MED ORDER — ACETAMINOPHEN 325 MG PO TABS: 325.0000 mg | ORAL_TABLET | Freq: Four times a day (QID) | ORAL | Status: DC | PRN

## 2018-11-23 MED ORDER — ACETAMINOPHEN 10 MG/ML IV SOLN
INTRAVENOUS | Status: AC
  Filled 2018-11-23: qty 100

## 2018-11-23 MED ORDER — METOCLOPRAMIDE HCL 10 MG PO TABS: 5.0000 mg | ORAL_TABLET | Freq: Three times a day (TID) | ORAL | Status: DC | PRN

## 2018-11-23 MED ORDER — DIPHENHYDRAMINE HCL 12.5 MG/5ML PO ELIX: 12.5000 mg | ORAL_SOLUTION | ORAL | Status: DC | PRN

## 2018-11-23 MED ORDER — PROPOFOL 10 MG/ML IV BOLUS
INTRAVENOUS | Status: AC
  Filled 2018-11-23: qty 20

## 2018-11-23 MED ORDER — BUPIVACAINE HCL (PF) 0.5 % IJ SOLN
INTRAMUSCULAR | Status: AC
  Filled 2018-11-23: qty 10

## 2018-11-23 MED ORDER — BUPIVACAINE-EPINEPHRINE (PF) 0.5% -1:200000 IJ SOLN
INTRAMUSCULAR | Status: DC | PRN
  Administered 2018-11-23: 30 mL

## 2018-11-23 MED ORDER — ONDANSETRON HCL 4 MG/2ML IJ SOLN
INTRAMUSCULAR | Status: DC | PRN
  Administered 2018-11-23: 4 mg via INTRAVENOUS

## 2018-11-23 MED ORDER — MAGNESIUM HYDROXIDE 400 MG/5ML PO SUSP
30.0000 mL | Freq: Every day | ORAL | Status: DC | PRN
  Administered 2018-11-25: 30 mL via ORAL
  Filled 2018-11-23: qty 30

## 2018-11-23 MED ORDER — OXYCODONE HCL 5 MG/5ML PO SOLN: 5.0000 mg | Freq: Once | ORAL | Status: AC | PRN

## 2018-11-23 MED ORDER — FAMOTIDINE 20 MG PO TABS
ORAL_TABLET | ORAL | Status: AC
  Administered 2018-11-23: 20 mg via ORAL
  Filled 2018-11-23: qty 1

## 2018-11-23 MED ORDER — DEXMEDETOMIDINE HCL IN NACL 200 MCG/50ML IV SOLN
INTRAVENOUS | Status: DC | PRN
  Administered 2018-11-23: .2 ug/kg/h via INTRAVENOUS

## 2018-11-23 MED ORDER — DEXMEDETOMIDINE HCL 200 MCG/2ML IV SOLN
INTRAVENOUS | Status: DC | PRN
  Administered 2018-11-23: 8 ug via INTRAVENOUS

## 2018-11-23 MED ORDER — DEXAMETHASONE SODIUM PHOSPHATE 10 MG/ML IJ SOLN
INTRAMUSCULAR | Status: AC
  Filled 2018-11-23: qty 1

## 2018-11-23 MED ORDER — HYDROCHLOROTHIAZIDE 25 MG PO TABS
12.5000 mg | ORAL_TABLET | Freq: Every day | ORAL | Status: DC
  Administered 2018-11-23 – 2018-11-26 (×4): 12.5 mg via ORAL
  Filled 2018-11-23 (×4): qty 1

## 2018-11-23 MED ORDER — BUPIVACAINE LIPOSOME 1.3 % IJ SUSP
INTRAMUSCULAR | Status: AC
  Filled 2018-11-23: qty 20

## 2018-11-23 MED ORDER — FENTANYL CITRATE (PF) 100 MCG/2ML IJ SOLN
25.0000 ug | INTRAMUSCULAR | Status: AC | PRN
  Administered 2018-11-23 (×2): 25 ug via INTRAVENOUS
  Administered 2018-11-23: 50 ug via INTRAVENOUS
  Administered 2018-11-23 (×2): 25 ug via INTRAVENOUS
  Administered 2018-11-23: 50 ug via INTRAVENOUS

## 2018-11-23 MED ORDER — FAMOTIDINE 20 MG PO TABS
20.0000 mg | ORAL_TABLET | Freq: Once | ORAL | Status: AC
  Administered 2018-11-23: 20 mg via ORAL

## 2018-11-23 MED ORDER — ATORVASTATIN CALCIUM 20 MG PO TABS
20.0000 mg | ORAL_TABLET | Freq: Every day | ORAL | Status: DC
  Administered 2018-11-23 – 2018-11-25 (×3): 20 mg via ORAL
  Filled 2018-11-23 (×3): qty 1

## 2018-11-23 MED ORDER — FLEET ENEMA 7-19 GM/118ML RE ENEM: 1.0000 | ENEMA | Freq: Once | RECTAL | Status: DC | PRN

## 2018-11-23 MED ORDER — SUGAMMADEX SODIUM 200 MG/2ML IV SOLN
INTRAVENOUS | Status: DC | PRN
  Administered 2018-11-23: 160 mg via INTRAVENOUS

## 2018-11-23 MED ORDER — BACITRACIN 50000 UNITS IM SOLR
INTRAMUSCULAR | Status: AC
  Filled 2018-11-23: qty 1

## 2018-11-23 MED ORDER — METOCLOPRAMIDE HCL 5 MG/ML IJ SOLN: 5.0000 mg | Freq: Three times a day (TID) | INTRAMUSCULAR | Status: DC | PRN

## 2018-11-23 MED ORDER — ONDANSETRON HCL 4 MG/2ML IJ SOLN: 4.0000 mg | Freq: Four times a day (QID) | INTRAMUSCULAR | Status: DC | PRN

## 2018-11-23 MED ORDER — ACETAMINOPHEN 500 MG PO TABS
1000.0000 mg | ORAL_TABLET | Freq: Four times a day (QID) | ORAL | Status: AC
  Administered 2018-11-23 – 2018-11-24 (×4): 1000 mg via ORAL
  Filled 2018-11-23 (×4): qty 2

## 2018-11-23 MED ORDER — ROCURONIUM BROMIDE 100 MG/10ML IV SOLN
INTRAVENOUS | Status: DC | PRN
  Administered 2018-11-23: 30 mg via INTRAVENOUS

## 2018-11-23 MED ORDER — CEFAZOLIN SODIUM-DEXTROSE 2-4 GM/100ML-% IV SOLN
2.0000 g | Freq: Four times a day (QID) | INTRAVENOUS | Status: AC
  Administered 2018-11-23 – 2018-11-24 (×3): 2 g via INTRAVENOUS
  Filled 2018-11-23 (×3): qty 100

## 2018-11-23 MED ORDER — SODIUM CHLORIDE (PF) 0.9 % IJ SOLN
INTRAMUSCULAR | Status: AC
  Filled 2018-11-23: qty 50

## 2018-11-23 MED ORDER — TRANEXAMIC ACID 1000 MG/10ML IV SOLN
INTRAVENOUS | Status: AC
  Filled 2018-11-23: qty 10

## 2018-11-23 MED ORDER — PROPOFOL 500 MG/50ML IV EMUL
INTRAVENOUS | Status: AC
  Filled 2018-11-23: qty 50

## 2018-11-23 MED ORDER — SUCCINYLCHOLINE CHLORIDE 20 MG/ML IJ SOLN
INTRAMUSCULAR | Status: AC
  Filled 2018-11-23: qty 1

## 2018-11-23 MED ORDER — CEFAZOLIN SODIUM-DEXTROSE 2-4 GM/100ML-% IV SOLN
INTRAVENOUS | Status: AC
  Filled 2018-11-23: qty 100

## 2018-11-23 MED ORDER — KETOROLAC TROMETHAMINE 15 MG/ML IJ SOLN
15.0000 mg | Freq: Four times a day (QID) | INTRAMUSCULAR | Status: AC
  Administered 2018-11-23 – 2018-11-24 (×4): 15 mg via INTRAVENOUS
  Filled 2018-11-23 (×4): qty 1

## 2018-11-23 MED ORDER — PROPOFOL 10 MG/ML IV BOLUS
INTRAVENOUS | Status: DC | PRN
  Administered 2018-11-23: 20 mg via INTRAVENOUS
  Administered 2018-11-23: 30 mg via INTRAVENOUS
  Administered 2018-11-23: 160 mg via INTRAVENOUS

## 2018-11-23 MED ORDER — SUGAMMADEX SODIUM 200 MG/2ML IV SOLN
INTRAVENOUS | Status: AC
  Filled 2018-11-23: qty 2

## 2018-11-23 MED ORDER — LACTATED RINGERS IV SOLN
INTRAVENOUS | Status: DC
  Administered 2018-11-23 (×2): via INTRAVENOUS

## 2018-11-23 MED ORDER — OXYCODONE HCL 5 MG PO TABS
5.0000 mg | ORAL_TABLET | ORAL | Status: DC | PRN
  Administered 2018-11-24: 10 mg via ORAL
  Administered 2018-11-24 – 2018-11-25 (×3): 5 mg via ORAL
  Administered 2018-11-25 (×3): 10 mg via ORAL
  Filled 2018-11-23: qty 1
  Filled 2018-11-23 (×4): qty 2
  Filled 2018-11-23 (×2): qty 1
  Filled 2018-11-23: qty 2

## 2018-11-23 MED ORDER — DOCUSATE SODIUM 100 MG PO CAPS
100.0000 mg | ORAL_CAPSULE | Freq: Two times a day (BID) | ORAL | Status: DC
  Administered 2018-11-23 – 2018-11-25 (×5): 100 mg via ORAL
  Filled 2018-11-23 (×6): qty 1

## 2018-11-23 MED ORDER — BUPIVACAINE-EPINEPHRINE (PF) 0.5% -1:200000 IJ SOLN
INTRAMUSCULAR | Status: AC
  Filled 2018-11-23: qty 30

## 2018-11-23 MED ORDER — HYDROMORPHONE HCL 1 MG/ML IJ SOLN: 0.2500 mg | INTRAMUSCULAR | Status: DC | PRN

## 2018-11-23 MED ORDER — DEXAMETHASONE SODIUM PHOSPHATE 10 MG/ML IJ SOLN
INTRAMUSCULAR | Status: DC | PRN
  Administered 2018-11-23: 10 mg via INTRAVENOUS

## 2018-11-23 MED ORDER — KETOROLAC TROMETHAMINE 30 MG/ML IJ SOLN
30.0000 mg | Freq: Once | INTRAMUSCULAR | Status: AC
  Administered 2018-11-23: 30 mg via INTRAVENOUS

## 2018-11-23 MED ORDER — PHENYLEPHRINE HCL 10 MG/ML IJ SOLN
INTRAMUSCULAR | Status: DC | PRN
Start: 2018-11-23 — End: 2018-11-23
  Administered 2018-11-23: 100 ug via INTRAVENOUS
  Administered 2018-11-23: 50 ug via INTRAVENOUS

## 2018-11-23 MED ORDER — LIDOCAINE HCL (PF) 2 % IJ SOLN
INTRAMUSCULAR | Status: AC
  Filled 2018-11-23: qty 10

## 2018-11-23 MED ORDER — FENTANYL CITRATE (PF) 100 MCG/2ML IJ SOLN
INTRAMUSCULAR | Status: DC | PRN
  Administered 2018-11-23 (×5): 50 ug via INTRAVENOUS

## 2018-11-23 MED ORDER — BISACODYL 10 MG RE SUPP: 10.0000 mg | Freq: Every day | RECTAL | Status: DC | PRN

## 2018-11-23 MED ORDER — TRAMADOL HCL 50 MG PO TABS: 50.0000 mg | ORAL_TABLET | Freq: Four times a day (QID) | ORAL | Status: DC | PRN

## 2018-11-23 MED ORDER — MIDAZOLAM HCL 2 MG/2ML IJ SOLN
INTRAMUSCULAR | Status: AC
  Filled 2018-11-23: qty 2

## 2018-11-23 MED ORDER — ROCURONIUM BROMIDE 50 MG/5ML IV SOLN
INTRAVENOUS | Status: AC
  Filled 2018-11-23: qty 1

## 2018-11-23 MED ORDER — OXYCODONE HCL 5 MG PO TABS
ORAL_TABLET | ORAL | Status: AC
  Administered 2018-11-23: 5 mg via ORAL
  Filled 2018-11-23: qty 1

## 2018-11-23 MED ORDER — SODIUM CHLORIDE 0.9 % IV SOLN
INTRAVENOUS | Status: DC | PRN
  Administered 2018-11-23: 60 mL

## 2018-11-23 MED ORDER — DEXMEDETOMIDINE HCL IN NACL 200 MCG/50ML IV SOLN
INTRAVENOUS | Status: AC
  Filled 2018-11-23: qty 50

## 2018-11-23 MED ORDER — MIDAZOLAM HCL 2 MG/2ML IJ SOLN
INTRAMUSCULAR | Status: DC | PRN
  Administered 2018-11-23: 2 mg via INTRAVENOUS

## 2018-11-23 SURGICAL SUPPLY — 59 items
BANDAGE ELASTIC 6 LF NS (GAUZE/BANDAGES/DRESSINGS) ×3 IMPLANT
BLADE SAW SAG 25X90X1.19 (BLADE) ×3 IMPLANT
BLADE SURG SZ20 CARB STEEL (BLADE) ×3 IMPLANT
CANISTER SUCT 1200ML W/VALVE (MISCELLANEOUS) ×3 IMPLANT
CANISTER SUCT 3000ML PPV (MISCELLANEOUS) ×3 IMPLANT
CEMENT BONE R 1X40 (Cement) ×6 IMPLANT
CEMENT VACUUM MIXING SYSTEM (MISCELLANEOUS) ×3 IMPLANT
CHLORAPREP W/TINT 26ML (MISCELLANEOUS) ×3 IMPLANT
COOLER POLAR GLACIER W/PUMP (MISCELLANEOUS) ×3 IMPLANT
COVER MAYO STAND STRL (DRAPES) IMPLANT
COVER WAND RF STERILE (DRAPES) IMPLANT
CUFF TOURN 24 STER (MISCELLANEOUS) IMPLANT
CUFF TOURN 30 STER DUAL PORT (MISCELLANEOUS) ×3 IMPLANT
DRAPE IMP U-DRAPE 54X76 (DRAPES) ×3 IMPLANT
DRAPE INCISE IOBAN 66X45 STRL (DRAPES) ×3 IMPLANT
DRAPE SHEET LG 3/4 BI-LAMINATE (DRAPES) ×3 IMPLANT
DRSG OPSITE POSTOP 4X10 (GAUZE/BANDAGES/DRESSINGS) ×3 IMPLANT
DRSG OPSITE POSTOP 4X8 (GAUZE/BANDAGES/DRESSINGS) IMPLANT
ELECT CAUTERY BLADE 6.4 (BLADE) ×3 IMPLANT
ELECT REM PT RETURN 9FT ADLT (ELECTROSURGICAL) ×3
ELECTRODE REM PT RTRN 9FT ADLT (ELECTROSURGICAL) ×1 IMPLANT
GLOVE BIO SURGEON STRL SZ7.5 (GLOVE) ×12 IMPLANT
GLOVE BIO SURGEON STRL SZ8 (GLOVE) ×12 IMPLANT
GLOVE BIOGEL PI IND STRL 8 (GLOVE) ×1 IMPLANT
GLOVE BIOGEL PI INDICATOR 8 (GLOVE) ×2
GLOVE INDICATOR 8.0 STRL GRN (GLOVE) ×3 IMPLANT
GOWN STRL REUS W/ TWL LRG LVL3 (GOWN DISPOSABLE) ×1 IMPLANT
GOWN STRL REUS W/ TWL XL LVL3 (GOWN DISPOSABLE) ×1 IMPLANT
GOWN STRL REUS W/TWL LRG LVL3 (GOWN DISPOSABLE) ×2
GOWN STRL REUS W/TWL XL LVL3 (GOWN DISPOSABLE) ×2
HOLDER FOLEY CATH W/STRAP (MISCELLANEOUS) IMPLANT
HOOD PEEL AWAY FLYTE STAYCOOL (MISCELLANEOUS) ×15 IMPLANT
IMMBOLIZER KNEE 19 BLUE UNIV (SOFTGOODS) ×3 IMPLANT
INSERT TIB BEARING 71 10 (Insert) ×3 IMPLANT
KIT TURNOVER KIT A (KITS) ×3 IMPLANT
KNEE CR FEMORAL RT 65MM (Femur) ×3 IMPLANT
NDL SAFETY ECLIPSE 18X1.5 (NEEDLE) ×2 IMPLANT
NEEDLE HYPO 18GX1.5 SHARP (NEEDLE) ×4
NEEDLE SPNL 20GX3.5 QUINCKE YW (NEEDLE) ×3 IMPLANT
NS IRRIG 1000ML POUR BTL (IV SOLUTION) ×3 IMPLANT
PACK TOTAL KNEE (MISCELLANEOUS) ×3 IMPLANT
PAD WRAPON POLAR KNEE (MISCELLANEOUS) ×1 IMPLANT
PATELLA SERIES A (Orthopedic Implant) ×3 IMPLANT
PLATE KNEE TIBIAL 71MM FIXED (Plate) ×3 IMPLANT
PULSAVAC PLUS IRRIG FAN TIP (DISPOSABLE) ×3
SOL .9 NS 3000ML IRR  AL (IV SOLUTION) ×2
SOL .9 NS 3000ML IRR UROMATIC (IV SOLUTION) ×1 IMPLANT
STAPLER SKIN PROX 35W (STAPLE) ×3 IMPLANT
SUCTION FRAZIER HANDLE 10FR (MISCELLANEOUS) ×2
SUCTION TUBE FRAZIER 10FR DISP (MISCELLANEOUS) ×1 IMPLANT
SUT VIC AB 0 CT1 36 (SUTURE) ×9 IMPLANT
SUT VIC AB 2-0 CT1 27 (SUTURE) ×6
SUT VIC AB 2-0 CT1 TAPERPNT 27 (SUTURE) ×3 IMPLANT
SYR 10ML LL (SYRINGE) ×3 IMPLANT
SYR 20CC LL (SYRINGE) ×3 IMPLANT
SYR 30ML LL (SYRINGE) ×9 IMPLANT
TIP FAN IRRIG PULSAVAC PLUS (DISPOSABLE) ×1 IMPLANT
TRAY FOLEY MTR SLVR 16FR STAT (SET/KITS/TRAYS/PACK) IMPLANT
WRAPON POLAR PAD KNEE (MISCELLANEOUS) ×3

## 2018-11-23 NOTE — Anesthesia Procedure Notes (Signed)
Procedure Name: Intubation Date/Time: 11/23/2018 10:39 AM Performed by: Jenel Lucks, RN Pre-anesthesia Checklist: Patient identified, Emergency Drugs available, Suction available, Patient being monitored and Timeout performed Patient Re-evaluated:Patient Re-evaluated prior to induction Oxygen Delivery Method: Circle system utilized Preoxygenation: Pre-oxygenation with 100% oxygen Induction Type: IV induction Ventilation: Mask ventilation without difficulty Laryngoscope Size: Miller and 2 Grade View: Grade I Tube type: Oral Tube size: 7.0 mm Number of attempts: 1 Airway Equipment and Method: Stylet Placement Confirmation: ETT inserted through vocal cords under direct vision,  positive ETCO2 and breath sounds checked- equal and bilateral Secured at: 19 (lips) cm Tube secured with: Tape Dental Injury: Teeth and Oropharynx as per pre-operative assessment

## 2018-11-23 NOTE — Transfer of Care (Signed)
Immediate Anesthesia Transfer of Care Note  Patient: Rose Roberts  Procedure(s) Performed: TOTAL KNEE ARTHROPLASTY-RIGHT INTERPRETER APPOINTMENT (Right )  Patient Location: PACU  Anesthesia Type:General  Level of Consciousness: drowsy and patient cooperative  Airway & Oxygen Therapy: Patient Spontanous Breathing and Patient connected to face mask oxygen  Post-op Assessment: Report given to RN and Post -op Vital signs reviewed and stable  Post vital signs: Reviewed and stable  Last Vitals:  Vitals Value Taken Time  BP    Temp    Pulse    Resp    SpO2      Last Pain:  Vitals:   11/23/18 0901  TempSrc: Oral  PainSc: 0-No pain         Complications: No apparent anesthesia complications

## 2018-11-23 NOTE — Anesthesia Post-op Follow-up Note (Signed)
Anesthesia QCDR form completed.        

## 2018-11-23 NOTE — Anesthesia Postprocedure Evaluation (Signed)
Anesthesia Post Note  Patient: Rose Roberts  Procedure(s) Performed: TOTAL KNEE ARTHROPLASTY-RIGHT INTERPRETER APPOINTMENT (Right )  Patient location during evaluation: PACU Anesthesia Type: General Level of consciousness: awake and alert Pain management: pain level controlled Vital Signs Assessment: post-procedure vital signs reviewed and stable Respiratory status: spontaneous breathing, nonlabored ventilation, respiratory function stable and patient connected to nasal cannula oxygen Cardiovascular status: blood pressure returned to baseline and stable Postop Assessment: no apparent nausea or vomiting Anesthetic complications: no     Last Vitals:  Vitals:   11/23/18 1325 11/23/18 1326  BP:  (!) 143/78  Pulse: 76 77  Resp: 20 18  Temp:    SpO2: (!) 88% 96%    Last Pain:  Vitals:   11/23/18 0901  TempSrc: Oral  PainSc: 0-No pain                 Cleda Mccreedy Suraya Vidrine

## 2018-11-23 NOTE — Plan of Care (Signed)

## 2018-11-23 NOTE — H&P (Signed)
Paper H&P to be scanned into permanent record. H&P reviewed and patient re-examined. No changes. 

## 2018-11-23 NOTE — Op Note (Signed)
11/23/2018  12:47 PM  Patient:   Rose Roberts  Pre-Op Diagnosis:   Degenerative joint disease, right knee.  Post-Op Diagnosis:   Same  Procedure:   Right TKA using all-cemented Bi[employer] Vanguard system with a 65 mm PCR femur, a 71 mm tibial tray with a 10 mm anterior stabilized e-poly insert, and a 31 x 8 mm all-poly 3-pegged domed patella.  Surgeon:   Maryagnes Amos, MD  Assistant:   Horris Latino, PA-C; Dwana Melena, PA-S  Anesthesia:   GET  Findings:   As above  Complications:   None  EBL:   10 cc  Fluids:   1000 cc crystalloid  UOP:   None  TT:   75 minutes at 300 mmHg  Drains:   None  Closure:   Staples  Implants:   As above  Brief Clinical Note:   The patient is a 68 year old female with a long history of progressively worsening right knee pain. The patient's symptoms have progressed despite medications, activity modification, injections, etc. The patient's history and examination were consistent with advanced degenerative joint disease of the right knee confirmed by plain radiographs. The patient presents at this time for a right total knee arthroplasty.  Procedure:   The patient was brought into the operating room. After adequate spinal anesthesia was obtained, the patient was lain in the supine position. A Foley catheter was placed by the nurse before the right lower extremity was prepped with ChloraPrep solution and draped sterilely. Preoperative antibiotics were administered. After verifying the proper laterality with a surgical timeout, the limb was exsanguinated with an Esmarch and the tourniquet inflated to 300 mmHg. A standard anterior approach to the knee was made through an approximately 7 inch incision. The incision was carried down through the subcutaneous tissues to expose superficial retinaculum. This was split the length of the incision and the medial flap elevated sufficiently to expose the medial retinaculum. The medial retinaculum was incised, leaving  a 3-4 mm cuff of tissue on the patella. This was extended distally along the medial border of the patellar tendon and proximally through the medial third of the quadriceps tendon. A subtotal fat pad excision was performed before the soft tissues were elevated off the anteromedial and anterolateral aspects of the proximal tibia to the level of the collateral ligaments. The anterior portions of the medial and lateral menisci were removed, as was the anterior cruciate ligament. With the knee flexed to 90, the external tibial guide was positioned and the appropriate proximal tibial cut made. This piece was taken to the back table where it was measured and found to be optimally replicated by a 70 mm component.  Attention was directed to the distal femur. The intramedullary canal was accessed through a 3/8" drill hole. The intramedullary guide was inserted and positioned in order to obtain a neutral flexion gap. The intercondylar block was positioned with care taken to avoid notching the anterior cortex of the femur. The appropriate cut was made. Next, the distal cutting block was placed at 6 of valgus alignment. Using the 9 mm slot, the distal cut was made. The distal femur was measured and found to be optimally replicated by the 65 mm component. The 65 mm 4-in-1 cutting block was positioned and first the posterior, then the posterior chamfer, the anterior chamfer, and finally the anterior cuts were made. At this point, the posterior portions medial and lateral menisci were removed. A trial reduction was performed using the appropriate femoral and tibial components  with the 10 mm insert. This demonstrated excellent stability to varus and valgus stressing both in flexion and extension while permitting full extension. Patella tracking was assessed and found to be excellent. Therefore, the tibial guide position was marked on the proximal tibia. The patella thickness was measured and found to be 20 mm. Therefore, the  appropriate cut was made. The patellar surface was measured and found to be optimally replicated by the 31 mm component. The three peg holes were drilled in place before the trial button was inserted. Patella tracking was assessed and found to be excellent, passing the "no thumb test". The lug holes were drilled into the distal femur before the trial component was removed, leaving only the tibial tray. The keel was then created using the appropriate tower, reamer, and punch.  The bony surfaces were prepared for cementing by irrigating them thoroughly with bacitracin saline solution via the jet lavage system. A bone plug was fashioned from some of the bone that had been removed previously and used to plug the distal femoral canal. In addition, 20 cc of Exparel diluted out to 60 cc with normal saline and 30 cc of 0.5% Sensorcaine were injected into the postero-medial and postero-lateral aspects of the knee, the medial and lateral gutter regions, and the peri-incisional tissues to help with postoperative analgesia. Meanwhile, the cement was being mixed on the back table. When it was ready, the tibial tray was cemented in first. The excess cement was removed using Personal assistant. Next, the femoral component was impacted into place. Again, the excess cement was removed using Personal assistant. The 10 mm trial insert was positioned and the knee brought into extension while the cement hardened. Finally, the patella was cemented into place and secured using the patellar clamp. Again, the excess cement was removed using Personal assistant. Once the cement had hardened, the knee was placed through a range of motion with the findings as described above. Therefore, the trial insert was removed and, after verifying that no cement had been retained posteriorly, the permanent 10 mm anterior stabilized E-polyethylene insert was positioned and secured using the appropriate key locking mechanism. Again the knee was placed through a  range of motion with the findings as described above.  The wound was copiously irrigated with bacitracin saline solution using the jet lavage system before the quadriceps tendon and retinacular layer were reapproximated using #0 Vicryl interrupted sutures. The superficial retinacular layer also was closed using a running #0 Vicryl suture. A total of 10 cc of transexemic acid (TXA) was injected intra-articularly before the subcutaneous tissues were closed in several layers using 2-0 Vicryl interrupted sutures. The skin was closed using staples. A sterile honeycomb dressing was applied to the skin before the leg was wrapped with an Ace wrap to accommodate the Polar Care device. The patient was then awakened and returned to the recovery room in satisfactory condition after tolerating the procedure well.

## 2018-11-23 NOTE — Anesthesia Preprocedure Evaluation (Signed)
Anesthesia Evaluation  Patient identified by MRN, date of birth, ID band Patient awake    Reviewed: Allergy & Precautions, H&P , NPO status , Patient's Chart, lab work & pertinent test results  History of Anesthesia Complications Negative for: history of anesthetic complications  Airway Mallampati: III  TM Distance: <3 FB Neck ROM: limited    Dental  (+) Poor Dentition, Missing, Upper Dentures, Lower Dentures   Pulmonary neg pulmonary ROS, neg shortness of breath,           Cardiovascular Exercise Tolerance: Good hypertension, (-) angina(-) Past MI and (-) DOE      Neuro/Psych negative neurological ROS  negative psych ROS   GI/Hepatic negative GI ROS, Neg liver ROS, neg GERD  ,  Endo/Other  negative endocrine ROS  Renal/GU      Musculoskeletal  (+) Arthritis ,   Abdominal   Peds  Hematology negative hematology ROS (+)   Anesthesia Other Findings Past Medical History: No date: Hyperlipidemia No date: Hypertension No date: Osteoarthritis of right knee  Past Surgical History: No date: APPENDECTOMY No date: BILATERAL CARPAL TUNNEL RELEASE No date: TUBAL LIGATION  BMI    Body Mass Index:  32.61 kg/m      Reproductive/Obstetrics negative OB ROS                             Anesthesia Physical Anesthesia Plan  ASA: III  Anesthesia Plan: General ETT   Post-op Pain Management:    Induction: Intravenous  PONV Risk Score and Plan: Ondansetron, Dexamethasone, Midazolam and Treatment may vary due to age or medical condition  Airway Management Planned: Oral ETT  Additional Equipment:   Intra-op Plan:   Post-operative Plan: Extubation in OR  Informed Consent: I have reviewed the patients History and Physical, chart, labs and discussed the procedure including the risks, benefits and alternatives for the proposed anesthesia with the patient or authorized representative who has  indicated his/her understanding and acceptance.     Dental Advisory Given  Plan Discussed with: Anesthesiologist, CRNA and Surgeon  Anesthesia Plan Comments: (History and consent via interpreter  Patient declines spinal  Patient consented for risks of anesthesia including but not limited to:  - adverse reactions to medications - damage to teeth, lips or other oral mucosa - sore throat or hoarseness - Damage to heart, brain, lungs or loss of life  Patient voiced understanding.)        Anesthesia Quick Evaluation

## 2018-11-23 NOTE — Plan of Care (Signed)
  Problem: Education: Goal: Knowledge of General Education information will improve Description Including pain rating scale, medication(s)/side effects and non-pharmacologic comfort measures Outcome: Progressing   Problem: Nutrition: Goal: Adequate nutrition will be maintained Outcome: Progressing   Problem: Coping: Goal: Level of anxiety will decrease Outcome: Progressing   Problem: Elimination: Goal: Will not experience complications related to bowel motility Outcome: Progressing Goal: Will not experience complications related to urinary retention Outcome: Progressing   Problem: Pain Managment: Goal: General experience of comfort will improve Outcome: Progressing   Problem: Safety: Goal: Ability to remain free from injury will improve Outcome: Progressing   Problem: Education: Goal: Knowledge of the prescribed therapeutic regimen will improve Outcome: Progressing Goal: Individualized Educational Video(s) Outcome: Progressing   Problem: Activity: Goal: Ability to avoid complications of mobility impairment will improve Outcome: Progressing Goal: Range of joint motion will improve Outcome: Progressing   Problem: Pain Management: Goal: Pain level will decrease with appropriate interventions Outcome: Progressing

## 2018-11-24 ENCOUNTER — Encounter: Payer: Self-pay | Admitting: Surgery

## 2018-11-24 DIAGNOSIS — Z96659 Presence of unspecified artificial knee joint: Secondary | ICD-10-CM | POA: Insufficient documentation

## 2018-11-24 LAB — CBC WITH DIFFERENTIAL/PLATELET
Abs Immature Granulocytes: 0.09 10*3/uL — ABNORMAL HIGH (ref 0.00–0.07)
Basophils Absolute: 0 10*3/uL (ref 0.0–0.1)
Basophils Relative: 0 %
Eosinophils Absolute: 0 10*3/uL (ref 0.0–0.5)
Eosinophils Relative: 0 %
HCT: 39.1 % (ref 36.0–46.0)
HEMOGLOBIN: 12.1 g/dL (ref 12.0–15.0)
Immature Granulocytes: 1 %
Lymphocytes Relative: 16 %
Lymphs Abs: 2.7 10*3/uL (ref 0.7–4.0)
MCH: 26.4 pg (ref 26.0–34.0)
MCHC: 30.9 g/dL (ref 30.0–36.0)
MCV: 85.2 fL (ref 80.0–100.0)
Monocytes Absolute: 0.5 10*3/uL (ref 0.1–1.0)
Monocytes Relative: 3 %
Neutro Abs: 13.8 10*3/uL — ABNORMAL HIGH (ref 1.7–7.7)
Neutrophils Relative %: 80 %
Platelets: 236 10*3/uL (ref 150–400)
RBC: 4.59 MIL/uL (ref 3.87–5.11)
RDW: 15.1 % (ref 11.5–15.5)
WBC: 17.1 10*3/uL — ABNORMAL HIGH (ref 4.0–10.5)
nRBC: 0 % (ref 0.0–0.2)

## 2018-11-24 LAB — BASIC METABOLIC PANEL
Anion gap: 11 (ref 5–15)
BUN: 11 mg/dL (ref 8–23)
CO2: 24 mmol/L (ref 22–32)
Calcium: 8.6 mg/dL — ABNORMAL LOW (ref 8.9–10.3)
Chloride: 104 mmol/L (ref 98–111)
Creatinine, Ser: 0.8 mg/dL (ref 0.44–1.00)
GFR calc Af Amer: >60 mL/min (ref >60)
GFR calc non Af Amer: >60 mL/min (ref >60)
Glucose, Bld: 145 mg/dL — ABNORMAL HIGH (ref 70–99)
Potassium: 3.6 mmol/L (ref 3.5–5.1)
Sodium: 139 mmol/L (ref 135–145)

## 2018-11-24 MED ORDER — TRAMADOL HCL 50 MG PO TABS: 50.0000 mg | ORAL_TABLET | Freq: Four times a day (QID) | ORAL | 0 refills | Status: DC | PRN

## 2018-11-24 MED ORDER — OXYCODONE HCL 5 MG PO TABS: 5.0000 mg | ORAL_TABLET | ORAL | 0 refills | Status: DC | PRN

## 2018-11-24 MED ORDER — ENOXAPARIN SODIUM 40 MG/0.4ML ~~LOC~~ SOLN: 40.0000 mg | SUBCUTANEOUS | 0 refills | Status: DC

## 2018-11-24 NOTE — Discharge Instructions (Signed)

## 2018-11-24 NOTE — Care Management Note (Addendum)
Case Management Note  Patient Details  Name: Rose Roberts MRN: [id number] Date of Birth: [birth year]  Subjective/Objective:                  RNCM met with patient and several daughters accompanied by Spanish interpreter.  She plans to return to home at discharge where she lives alone but not concerned with that.  She will need a rolling walker and bedside commode.  She did not have preference of home health agencies and opted with Surgeon's preference of [facility].  She uses [facility] on [address] when her clinic's pharmacy is closed.  Action/Plan:   Home health list provided via [email or web address]. Lovenox 2m injection daily for 14 days not refills [place] [facility] [facility]  Referral called to [facility] requesting Spanish speaking. Update: DME has been delivered to this room. Update: Lovenox $1.30  Expected Discharge Date:                  Expected Discharge Plan:     In-House Referral:     Discharge planning Services  CM Consult  Post Acute Care Choice:  Home Health, Durable Medical Equipment Choice offered to:  Patient, Adult Children  DME Arranged:  3-N-1, Walker rolling [place]ME [place]  [facility]  PT [facility]  [facility]  Status of Service:  In process, will continue to follow  If discussed at Long Length of Stay Meetings, dates discussed:    Additional Comments:  AMarshell Garfinkel RN 11/24/2018, 1:26 PM

## 2018-11-24 NOTE — NC FL2 (Signed)
[place] MEDICAID FL2 LEVEL OF CARE SCREENING TOOL     IDENTIFICATION  Patient Name: TENLEY LIMBACHER Birthdate: 1951/05/03 Sex: female Admission Date (Current Location): 11/23/2018  [facility] and [place] Number:  [address] ([id number]) Facility and Address:  [facility], [address], [place], [place] [address]      Provider Number: [id number]  Attending Physician Name and Address:  Christena Flake, MD  Relative Name and Phone Number:       Current Level of Care: Hospital Recommended Level of Care: Skilled Nursing Facility Prior Approval Number:    Date Approved/Denied:   PASRR Number: [id number])  Discharge Plan: SNF    Current Diagnoses: Patient Active Problem List   Diagnosis Date Noted  . Status post total knee replacement using cement, right 11/23/2018  . Varicose veins of bilateral lower extremities with pain 09/30/2016  . Chronic venous insufficiency 09/30/2016  . Pain in limb 09/30/2016    Orientation RESPIRATION BLADDER Height & Weight     Self, Time, Situation, Place  Normal Continent Weight: 190 lb (86.2 kg) Height:  5\' 4"  (162.6 cm)  BEHAVIORAL SYMPTOMS/MOOD NEUROLOGICAL BOWEL NUTRITION STATUS      Continent Diet(Diet: Heart Healthy )  AMBULATORY STATUS COMMUNICATION OF NEEDS Skin   Extensive Assist Verbally Surgical wounds(Incision: Right Knee. )                       Personal Care Assistance Level of Assistance  Bathing, Feeding, Dressing Bathing Assistance: Limited assistance Feeding assistance: Independent Dressing Assistance: Limited assistance     Functional Limitations Info  Sight, Hearing, Speech Sight Info: Adequate Hearing Info: Adequate Speech Info: Adequate    SPECIAL CARE FACTORS FREQUENCY  PT (By licensed PT), OT (By licensed OT)     PT Frequency: (5) OT Frequency: (5)            Contractures      Additional Factors Info  Code Status, Allergies Code Status Info: (Full Code.  ) Allergies Info: (No Known Allergies. )           Current Medications (11/24/2018):  This is the current hospital active medication list Current Facility-Administered Medications  Medication Dose Route Frequency Provider Last Rate Last Dose  . 0.9 %  sodium chloride infusion   Intravenous Continuous Poggi, Excell Seltzer, MD 75 mL/hr at 11/23/18 1945    . acetaminophen (TYLENOL) tablet 1,000 mg  1,000 mg Oral Q6H Poggi, Excell Seltzer, MD   1,000 mg at 11/24/18 0655  . acetaminophen (TYLENOL) tablet 325-650 mg  325-650 mg Oral Q6H PRN Poggi, Excell Seltzer, MD      . atorvastatin (LIPITOR) tablet 20 mg  20 mg Oral QHS Poggi, Excell Seltzer, MD   20 mg at 11/23/18 2045  . bisacodyl (DULCOLAX) suppository 10 mg  10 mg Rectal Daily PRN Poggi, Excell Seltzer, MD      . diphenhydrAMINE (BENADRYL) 12.5 MG/5ML elixir 12.5-25 mg  12.5-25 mg Oral Q4H PRN Poggi, Excell Seltzer, MD      . docusate sodium (COLACE) capsule 100 mg  100 mg Oral BID Christena Flake, MD   100 mg at 11/23/18 2045  . enoxaparin (LOVENOX) injection 40 mg  40 mg Subcutaneous Q24H Poggi, Excell Seltzer, MD      . hydrochlorothiazide (HYDRODIURIL) tablet 12.5 mg  12.5 mg Oral Daily Poggi, Excell Seltzer, MD   12.5 mg at 11/23/18 1829  . HYDROmorphone (DILAUDID) injection 0.25-0.5 mg  0.25-0.5 mg Intravenous Q2H PRN  Poggi, Excell Seltzer, MD      . ketorolac (TORADOL) 15 MG/ML injection 15 mg  15 mg Intravenous Q6H Poggi, Excell Seltzer, MD   15 mg at 11/24/18 0654  . magnesium hydroxide (MILK OF MAGNESIA) suspension 30 mL  30 mL Oral Daily PRN Poggi, Excell Seltzer, MD      . metoCLOPramide (REGLAN) tablet 5-10 mg  5-10 mg Oral Q8H PRN Poggi, Excell Seltzer, MD       Or  . metoCLOPramide (REGLAN) injection 5-10 mg  5-10 mg Intravenous Q8H PRN Poggi, Excell Seltzer, MD      . ondansetron (ZOFRAN) tablet 4 mg  4 mg Oral Q6H PRN Poggi, Excell Seltzer, MD       Or  . ondansetron (ZOFRAN) injection 4 mg  4 mg Intravenous Q6H PRN Poggi, Excell Seltzer, MD      . oxyCODONE (Oxy IR/ROXICODONE) immediate release tablet 5-10 mg  5-10 mg Oral Q4H PRN Poggi,  Excell Seltzer, MD      . pantoprazole (PROTONIX) EC tablet 40 mg  40 mg Oral Daily Poggi, Excell Seltzer, MD      . sodium phosphate (FLEET) 7-19 GM/118ML enema 1 enema  1 enema Rectal Once PRN Poggi, Excell Seltzer, MD      . traMADol Janean Sark) tablet 50 mg  50 mg Oral Q6H PRN Poggi, Excell Seltzer, MD         Discharge Medications: Please see discharge summary for a list of discharge medications.  Relevant Imaging Results:  Relevant Lab Results:   Additional Information (SSN: [id number])  Tyresa Prindiville, Darleen Crocker, LCSW

## 2018-11-24 NOTE — Care Management (Signed)
Lovenox cannot be filled at [facility] as planned [phone]. RNCM will follow up with patient.

## 2018-11-24 NOTE — Evaluation (Signed)
Physical Therapy Evaluation Patient Details Name: Rose CongressBerta A Fullen MRN: [id number] DOB: 09/26/1951 Today's Date: 11/24/2018   History of Present Illness  Patient is 68 yo female s/p R TKA, WBAT. PMH of HTN, HLD.  Clinical Impression  Patient in bed, easily woken. Spanish interpretor utilized throughout session. Patient reported no pain in R knee at rest, ranged from 8/10 to 5/10 at end of session. Patient reported she lives alone in one story home, previously independent in ADLs/IADLs, no AD used for ambulation.  Patient exhibited most pain signs with quad sets in supine. Able to perform exercises without physical assist, with verbal/visual cues. Pt mobilized to EOB with supervision and use of bed rails. PT educated patient on precautions, use of AD, and safety during transfers. Sit <> stand with RW and CGA. Ambulated ~2925ft total with RW, CGA and intermittent cues for AD management. Also demonstrated standard commode transfers with CGA and use of grab bars.  Overall the patient demonstrated deficits (see "PT Problem List") that impede the patient's functional abilities, safety, and mobility and would benefit from skilled PT intervention. Recommendation is HHPT.     Follow Up Recommendations Home health PT    Equipment Recommendations  Rolling walker with 5" wheels    Recommendations for Other Services       Precautions / Restrictions Precautions Precautions: Fall Restrictions Weight Bearing Restrictions: Yes RLE Weight Bearing: Weight bearing as tolerated      Mobility  Bed Mobility Overal bed mobility: Needs Assistance Bed Mobility: Supine to Sit     Supine to sit: Supervision     General bed mobility comments: heavy use of bed rails  Transfers Overall transfer level: Needs assistance Equipment used: Rolling walker (2 wheeled) Transfers: Sit to/from Stand Sit to Stand: Min guard         General transfer comment: Verbal cues for hand placement, sequencing of  movements  Ambulation/Gait Ambulation/Gait assistance: Min guard Gait Distance (Feet): 25 Feet Assistive device: Rolling walker (2 wheeled)       General Gait Details: Antalgic gait on R, decreased weight bearing on R, decreased gait velocity, cues for AD management  Stairs            Wheelchair Mobility    Modified Rankin (Stroke Patients Only)       Balance Overall balance assessment: Needs assistance Sitting-balance support: Feet supported Sitting balance-Leahy Scale: Fair       Standing balance-Leahy Scale: Fair                               Pertinent Vitals/Pain Pain Assessment: 0-10 Pain Score: 8  Pain Location: With quad sets, 8/10. Patient reported pain was 5/10 at end of mobility. Pain Descriptors / Indicators: Grimacing;Moaning;Guarding Pain Intervention(s): Limited activity within patient's tolerance;Repositioned;Ice applied;Monitored during session;Patient requesting pain meds-RN notified    Home Living Family/patient expects to be discharged to:: Private residence Living Arrangements: Alone(daughter is staying with her for 1 week to assist as needed) Available Help at Discharge: Family Type of Home: House Home Access: Stairs to enter Entrance Stairs-Rails: Right;Left;Can reach both Secretary/administratorntrance Stairs-Number of Steps: 4 Home Layout: One level Home Equipment: None      Prior Function Level of Independence: Independent               Hand Dominance        Extremity/Trunk Assessment   Upper Extremity Assessment Upper Extremity Assessment: Overall WFL for tasks assessed  Lower Extremity Assessment Lower Extremity Assessment: RLE deficits/detail;LLE deficits/detail RLE Deficits / Details: due to R TKA RLE: Unable to fully assess due to pain LLE Deficits / Details: WFLs for tasks assessed    Cervical / Trunk Assessment Cervical / Trunk Assessment: Normal  Communication   Communication: Interpreter utilized(spanish)   Cognition Arousal/Alertness: Awake/alert Behavior During Therapy: WFL for tasks assessed/performed Overall Cognitive Status: Within Functional Limits for tasks assessed                                        General Comments      Exercises Total Joint Exercises Ankle Circles/Pumps: AROM;10 reps;Both Quad Sets: AROM;Right;10 reps;Strengthening Short Arc Quad: AROM;Right;10 reps;Strengthening Hip ABduction/ADduction: AROM;Right;10 reps Other Exercises Other Exercises: Patient demonstrated standard commode transfer with step by step visual cues for hand placement/sequencing of movements, CGA.   Assessment/Plan    PT Assessment Patient needs continued PT services  PT Problem List Decreased strength;Pain;Decreased range of motion;Decreased activity tolerance;Decreased knowledge of use of DME;Decreased balance;Decreased mobility;Decreased knowledge of precautions       PT Treatment Interventions DME instruction;Therapeutic exercise;Gait training;Balance training;Stair training;Neuromuscular re-education;Functional mobility training;Therapeutic activities;Patient/family education    PT Goals (Current goals can be found in the Care Plan section)  Acute Rehab PT Goals Patient Stated Goal: Patient wants to decrease pain PT Goal Formulation: With patient Time For Goal Achievement: 12/08/18 Potential to Achieve Goals: Good    Frequency BID   Barriers to discharge        Co-evaluation               AM-PAC PT "6 Clicks" Mobility  Outcome Measure Help needed turning from your back to your side while in a flat bed without using bedrails?: A Little Help needed moving from lying on your back to sitting on the side of a flat bed without using bedrails?: A Little Help needed moving to and from a bed to a chair (including a wheelchair)?: A Little Help needed standing up from a chair using your arms (e.g., wheelchair or bedside chair)?: A Little Help needed to walk  in hospital room?: A Little Help needed climbing 3-5 steps with a railing? : A Lot 6 Click Score: 17    End of Session Equipment Utilized During Treatment: Gait belt Activity Tolerance: Patient limited by pain Patient left: in chair;with chair alarm set;with SCD's reapplied;with nursing/sitter in room;with call bell/phone within reach;with family/visitor present Nurse Communication: Mobility status PT Visit Diagnosis: Difficulty in walking, not elsewhere classified (R26.2);Other abnormalities of gait and mobility (R26.89);Pain Pain - Right/Left: Right Pain - part of body: Knee    Time: [id number] PT Time Calculation (min) (ACUTE ONLY): 32 min   Charges:   PT Evaluation $PT Eval Low Complexity: 1 Low PT Treatments $Therapeutic Activity: 8-22 mins       Olga Coasteriana Piero Mustard PT, DPT 9:35 AM,11/24/18 [phone]

## 2018-11-24 NOTE — Progress Notes (Signed)
Subjective: 1 Day Post-Op Procedure(s) (LRB): TOTAL KNEE ARTHROPLASTY-RIGHT INTERPRETER APPOINTMENT (Right) Patient reports pain as mild, increased pain with movement of the right knee. Patient is well, and has had no acute complaints or problems PT and care management to assist with discharge planning. Negative for chest pain and shortness of breath Fever: no Gastrointestinal:Negative for nausea and vomiting Interpretation provided by tablet technology.  Objective: Vital signs in last 24 hours: Temp:  [97.4 F (36.3 C)-98.3 F (36.8 C)] 98.3 F (36.8 C) (02/07 0031) Pulse Rate:  [70-93] 93 (02/07 0031) Resp:  [16-24] 16 (02/07 0031) BP: (129-159)/(67-87) 129/67 (02/07 0031) SpO2:  [88 %-100 %] 92 % (02/07 0031) Weight:  [86.2 kg] 86.2 kg (02/06 0901)  Intake/Output from previous day:  Intake/Output Summary (Last 24 hours) at 11/24/2018 0733 Last data filed at 11/24/2018 0440 Gross per 24 hour  Intake 1650.64 ml  Output 1785 ml  Net -134.36 ml    Intake/Output this shift: No intake/output data recorded.  Labs: Recent Labs    11/24/18 0414  HGB 12.1   Recent Labs    11/24/18 0414  WBC 17.1*  RBC 4.59  HCT 39.1  PLT 236   Recent Labs    11/24/18 0414  NA 139  K 3.6  CL 104  CO2 24  BUN 11  CREATININE 0.80  GLUCOSE 145*  CALCIUM 8.6*   No results for input(s): LABPT, INR in the last 72 hours.   EXAM General - Patient is Alert, Appropriate and Oriented Extremity - ABD soft Sensation intact distally Intact pulses distally Dorsiflexion/Plantar flexion intact Incision: dressing C/D/I No cellulitis present Dressing/Incision - clean, dry, no drainage Motor Function - intact, moving foot and toes well on exam.   Past Medical History:  Diagnosis Date  . Hyperlipidemia   . Hypertension   . Osteoarthritis of right knee    Assessment/Plan: 1 Day Post-Op Procedure(s) (LRB): TOTAL KNEE ARTHROPLASTY-RIGHT INTERPRETER APPOINTMENT (Right) Active  Problems:   Status post total knee replacement using cement, right  Estimated body mass index is 32.61 kg/m as calculated from the following:   Height as of this encounter: 5\' 4"  (1.626 m).   Weight as of this encounter: 86.2 kg. Advance diet Up with therapy D/C IV fluids when tolerating po intake.  Labs reviewed this AM, WBC 17.1.  CBC and BMP ordered for tomorrow morning. Denies any urinary frequency, SOB or chest pain. Up with therapy today. Patient is passing gas without pain this AM. Plan for possible discharge home tomorrow based on progress with PT.  DVT Prophylaxis - Lovenox, Foot Pumps and TED hose Weight-Bearing as tolerated to right leg  J. Horris LatinoLance Coady Train, PA-C [facility] 11/24/2018, 7:33 AM

## 2018-11-24 NOTE — Progress Notes (Signed)
Clinical Social Worker (CSW) received SNF consult. PT is recommending home health. RN case manager aware of above. Please reconsult if future social work needs arise. CSW signing off.   Mckenize Mezera, LCSW [phone] 

## 2018-11-24 NOTE — Plan of Care (Signed)

## 2018-11-24 NOTE — Progress Notes (Signed)
Physical Therapy Treatment Patient Details Name: Rose CongressBerta A Roberts MRN: [id number] DOB: 09/07/1951 Today's Date: 11/24/2018    History of Present Illness Patient is 68 yo female s/p R TKA, WBAT. PMH of HTN, HLD.    PT Comments    Patient in bed with family at beside, agreeable to PT. Reported no pain at rest, exhibited pain signs with therapeutic activities, limited in SLR due to pain. Mobilized to EOB with supervision and use of bed rails, sit <> stand with CGA and RW. Patient educated multiple times on hand placement during transfers to increase safety with fair carry over, would benefit from further reinforcement. Patient was able to ambulate ~13590ft with RW and CGA.  Patient with initially heavy use of UE, step to gait pattern, heavy footfalls. With maximum verbal, tactile, visual cues pt able to progress to step through pattern, improved weight bearing on RLE. Intermittent assistance for AD management/instruction. The patient would benefit from further skilled PT to continue to progress towards goals. Patient will need to demonstrate stair navigation in AM.    Follow Up Recommendations  Home health PT     Equipment Recommendations  Rolling walker with 5" wheels    Recommendations for Other Services       Precautions / Restrictions Precautions Precautions: Fall Restrictions Weight Bearing Restrictions: Yes RLE Weight Bearing: Weight bearing as tolerated    Mobility  Bed Mobility Overal bed mobility: Needs Assistance Bed Mobility: Supine to Sit     Supine to sit: Supervision     General bed mobility comments: heavy use of bed rails  Transfers Overall transfer level: Needs assistance Equipment used: Rolling walker (2 wheeled) Transfers: Sit to/from Stand Sit to Stand: Min guard         General transfer comment: Verbal cues for hand placement, sequencing of movements  Ambulation/Gait Ambulation/Gait assistance: Min guard Gait Distance (Feet): 190 Feet Assistive  device: Rolling walker (2 wheeled)   Gait velocity: decreased   General Gait Details: Patient with initially heavy use of UE, step to gait pattern, heavy footfalls. With maximum verbal, tactile, visual cues pt able to progress to step through pattern, improved weight bearing on RLE. Intermittent assistance for AD management/instruction.   Stairs             Wheelchair Mobility    Modified Rankin (Stroke Patients Only)       Balance Overall balance assessment: Needs assistance Sitting-balance support: Feet supported Sitting balance-Leahy Scale: Fair       Standing balance-Leahy Scale: Fair                              Cognition Arousal/Alertness: Awake/alert Behavior During Therapy: WFL for tasks assessed/performed Overall Cognitive Status: Within Functional Limits for tasks assessed                                        Exercises Total Joint Exercises Quad Sets: AROM;Right;10 reps;Strengthening Straight Leg Raises: AROM;Right;5 reps Knee Flexion: AROM;Right;10 reps Goniometric ROM: -5 extension to 60deg flexion on RLE    General Comments        Pertinent Vitals/Pain Pain Assessment: 0-10 Pain Score: 3  Pain Location: while ambulating Pain Descriptors / Indicators: Grimacing;Discomfort Pain Intervention(s): Limited activity within patient's tolerance;Repositioned;Ice applied;Monitored during session    Home Living  Prior Function            PT Goals (current goals can now be found in the care plan section) Progress towards PT goals: Progressing toward goals    Frequency    BID      PT Plan Current plan remains appropriate    Co-evaluation              AM-PAC PT "6 Clicks" Mobility   Outcome Measure  Help needed turning from your back to your side while in a flat bed without using bedrails?: A Little Help needed moving from lying on your back to sitting on the side of a flat  bed without using bedrails?: A Little Help needed moving to and from a bed to a chair (including a wheelchair)?: A Little Help needed standing up from a chair using your arms (e.g., wheelchair or bedside chair)?: A Little Help needed to walk in hospital room?: A Little Help needed climbing 3-5 steps with a railing? : A Lot 6 Click Score: 17    End of Session Equipment Utilized During Treatment: Gait belt Activity Tolerance: Patient tolerated treatment well Patient left: in chair;with chair alarm set;with SCD's reapplied;with nursing/sitter in room;with call bell/phone within reach;with family/visitor present Nurse Communication: Mobility status Pain - Right/Left: Right Pain - part of body: Knee     Time: 4235-3614 PT Time Calculation (min) (ACUTE ONLY): 44 min  Charges:  $Gait Training: 23-37 mins $Therapeutic Exercise: 8-22 mins                     Olga Coaster PT, DPT 2:11 PM,11/24/18 [phone]

## 2018-11-25 ENCOUNTER — Inpatient Hospital Stay: Payer: Medicare Other

## 2018-11-25 LAB — CBC
HCT: 34.1 % — ABNORMAL LOW (ref 36.0–46.0)
HEMOGLOBIN: 10.9 g/dL — AB (ref 12.0–15.0)
MCH: 26.8 pg (ref 26.0–34.0)
MCHC: 32 g/dL (ref 30.0–36.0)
MCV: 83.8 fL (ref 80.0–100.0)
Platelets: 199 10*3/uL (ref 150–400)
RBC: 4.07 MIL/uL (ref 3.87–5.11)
RDW: 15.4 % (ref 11.5–15.5)
WBC: 12.5 10*3/uL — ABNORMAL HIGH (ref 4.0–10.5)
nRBC: 0 % (ref 0.0–0.2)

## 2018-11-25 LAB — BASIC METABOLIC PANEL
ANION GAP: 4 — AB (ref 5–15)
BUN: 17 mg/dL (ref 8–23)
CO2: 28 mmol/L (ref 22–32)
Calcium: 8 mg/dL — ABNORMAL LOW (ref 8.9–10.3)
Chloride: 108 mmol/L (ref 98–111)
Creatinine, Ser: 0.64 mg/dL (ref 0.44–1.00)
GFR calc Af Amer: >60 mL/min (ref >60)
Glucose, Bld: 109 mg/dL — ABNORMAL HIGH (ref 70–99)
Potassium: 3.5 mmol/L (ref 3.5–5.1)
Sodium: 140 mmol/L (ref 135–145)

## 2018-11-25 NOTE — Progress Notes (Signed)
Physical Therapy Treatment Patient Details Name: Rose Roberts MRN: [id number] DOB: 05-04-1951 Today's Date: 11/25/2018    History of Present Illness Patient is 68 yo female s/p R TKA, WBAT. PMH of HTN, HLD.    PT Comments    A Spanish interpreter was used during this treatment.  Pt presented as very apprehensive and reported 8/10 pain in R knee.  She performed all supine there ex with either VC's or AAROM assistance.  PT allowed for time for questions and educated pt concerning importance of frequent movement during the recovery process.  PT also issued a Spanish HEP and pt was agreeable to all education.  Pt will continue to benefit from skilled PT with focus on strength, ROM, tolerance to activity and pain management.     Follow Up Recommendations  Home health PT     Equipment Recommendations  Rolling walker with 5" wheels;3in1 (PT)    Recommendations for Other Services       Precautions / Restrictions Precautions Precautions: Fall Restrictions Weight Bearing Restrictions: Yes RLE Weight Bearing: Weight bearing as tolerated    Mobility  Bed Mobility Overal bed mobility: (Did not perform due to pt report of increased pain)                Transfers                    Ambulation/Gait                 Stairs             Wheelchair Mobility    Modified Rankin (Stroke Patients Only)       Balance                                            Cognition Arousal/Alertness: Awake/alert Behavior During Therapy: WFL for tasks assessed/performed Overall Cognitive Status: Within Functional Limits for tasks assessed                                        Exercises Total Joint Exercises Ankle Circles/Pumps: AROM;Both;15 reps Quad Sets: AROM;Right;Strengthening;15 reps;Supine Towel Squeeze: Both;15 reps;Supine Short Arc Quad: AROM;Right;Strengthening;15 reps Heel Slides: AAROM;Strengthening;Right;15  reps;Supine Hip ABduction/ADduction: AROM;Right;10 reps Other Exercises Other Exercises: Education regarding pain science and importance of frequent movement x4 min    General Comments        Pertinent Vitals/Pain Pain Assessment: 0-10 Pain Score: 8  Pain Location: R knee Pain Intervention(s): Monitored during session    Home Living                      Prior Function            PT Goals (current goals can now be found in the care plan section) Acute Rehab PT Goals Patient Stated Goal: Patient wants to decrease pain PT Goal Formulation: With patient Time For Goal Achievement: 12/08/18 Potential to Achieve Goals: Good Progress towards PT goals: PT to reassess next treatment    Frequency    BID      PT Plan Current plan remains appropriate    Co-evaluation              AM-PAC PT "6 Clicks" Mobility   Outcome Measure  Help needed  turning from your back to your side while in a flat bed without using bedrails?: A Little Help needed moving from lying on your back to sitting on the side of a flat bed without using bedrails?: A Little Help needed moving to and from a bed to a chair (including a wheelchair)?: A Little Help needed standing up from a chair using your arms (e.g., wheelchair or bedside chair)?: A Little Help needed to walk in hospital room?: A Little Help needed climbing 3-5 steps with a railing? : A Little 6 Click Score: 18    End of Session   Activity Tolerance: Patient limited by pain Patient left: in bed;with call bell/phone within reach;with bed alarm set;with family/visitor present   PT Visit Diagnosis: Muscle weakness (generalized) (M62.81);Pain Pain - Right/Left: Right Pain - part of body: Knee     Time: 4650-3546 PT Time Calculation (min) (ACUTE ONLY): 17 min  Charges:  $Therapeutic Exercise: 8-22 mins                     Glenetta Hew, PT, DPT    Glenetta Hew 11/25/2018, 5:43 PM

## 2018-11-25 NOTE — Progress Notes (Signed)
Physical Therapy Treatment Patient Details Name: Rose Roberts MRN: [id number] DOB: 11-13-1950 Today's Date: 11/25/2018    History of Present Illness Patient is 68 yo female s/p R TKA, WBAT. PMH of HTN, HLD.    PT Comments    In person interpreter used during session.  Pt reports generally increased pain this am.Participated in exercises as described below.  To edge of bed with min a.  Stood and ambulated to rehab gym with step to pattern progressing to step though as distance progressed.  She completed stair training this am with 2 rails and verbal cues for sequencing.  Overall does well but limited by pain today.  PA in during session and stated he will order doppler as a precaution.  Will await results for PM session and continue as appropriate.    Follow Up Recommendations  Home health PT     Equipment Recommendations  Rolling walker with 5" wheels;3in1 (PT)    Recommendations for Other Services       Precautions / Restrictions Precautions Precautions: Fall Restrictions Weight Bearing Restrictions: Yes RLE Weight Bearing: Weight bearing as tolerated    Mobility  Bed Mobility Overal bed mobility: Needs Assistance Bed Mobility: Supine to Sit;Sit to Supine     Supine to sit: Min assist Sit to supine: Min assist   General bed mobility comments: due to pain  Transfers Overall transfer level: Needs assistance Equipment used: Rolling walker (2 wheeled) Transfers: Sit to/from Stand Sit to Stand: Min assist         General transfer comment: Verbal cues for hand placement, sequencing of movements, some unsteadiness noted.  Ambulation/Gait Ambulation/Gait assistance: Min guard Gait Distance (Feet): 150 Feet Assistive device: Rolling walker (2 wheeled) Gait Pattern/deviations: Step-to pattern;Step-through pattern Gait velocity: decreased   General Gait Details: initially step though but progressed to step though during session   Stairs Stairs: Yes Stairs  assistance: Min guard Stair Management: Two rails;Step to pattern Number of Stairs: 4 General stair comments: good stair navigation with verbal cues for sequencing.   Wheelchair Mobility    Modified Rankin (Stroke Patients Only)       Balance Overall balance assessment: Needs assistance Sitting-balance support: Feet supported Sitting balance-Leahy Scale: Fair     Standing balance support: Bilateral upper extremity supported Standing balance-Leahy Scale: Fair                              Cognition Arousal/Alertness: Awake/alert Behavior During Therapy: WFL for tasks assessed/performed Overall Cognitive Status: Within Functional Limits for tasks assessed                                        Exercises Total Joint Exercises Goniometric ROM: 3-60- limited by pain Other Exercises Other Exercises: supine ankle pumps, quad sets, heel slides,slr and ab/add x 7 rep limited by pain    General Comments        Pertinent Vitals/Pain Pain Assessment: Faces Faces Pain Scale: Hurts whole lot Pain Location: Pain medication given but pt reports increased pain in RLE today.   Pain Descriptors / Indicators: Grimacing;Discomfort;Operative site guarding Pain Intervention(s): Limited activity within patient's tolerance;Monitored during session;Premedicated before session    Home Living                      Prior Function  PT Goals (current goals can now be found in the care plan section) Progress towards PT goals: Progressing toward goals    Frequency    BID      PT Plan Current plan remains appropriate    Co-evaluation              AM-PAC PT "6 Clicks" Mobility   Outcome Measure  Help needed turning from your back to your side while in a flat bed without using bedrails?: A Little Help needed moving from lying on your back to sitting on the side of a flat bed without using bedrails?: A Little Help needed moving to  and from a bed to a chair (including a wheelchair)?: A Little Help needed standing up from a chair using your arms (e.g., wheelchair or bedside chair)?: A Little Help needed to walk in hospital room?: A Little Help needed climbing 3-5 steps with a railing? : A Little 6 Click Score: 18    End of Session Equipment Utilized During Treatment: Gait belt Activity Tolerance: Patient limited by pain Patient left: in bed;with call bell/phone within reach;with bed alarm set Nurse Communication: Other (comment) Pain - Right/Left: Right Pain - part of body: Knee     Time: 2694-8546 PT Time Calculation (min) (ACUTE ONLY): 32 min  Charges:  $Gait Training: 8-22 mins $Therapeutic Exercise: 8-22 mins                     Danielle Dess, PTA 11/25/18, 10:10 AM

## 2018-11-25 NOTE — Progress Notes (Signed)
Subjective: 2 Days Post-Op Procedure(s) (LRB): TOTAL KNEE ARTHROPLASTY-RIGHT INTERPRETER APPOINTMENT (Right) Patient reports moderate pain, increased from yesterday. Patient is well, and has had no acute complaints or problems Plan for discharge home with HHPT. Negative for chest pain and shortness of breath Fever: no Gastrointestinal:Negative for nausea and vomiting Interpretation provided by interpreter while patient was performing PT.  Objective: Vital signs in last 24 hours: Temp:  [98 F (36.7 C)-98.4 F (36.9 C)] 98.1 F (36.7 C) (02/08 0736) Pulse Rate:  [68-78] 78 (02/08 0736) Resp:  [18] 18 (02/08 0020) BP: (122-142)/(57-76) 140/65 (02/08 0736) SpO2:  [95 %] 95 % (02/08 0736)  Intake/Output from previous day: No intake or output data in the 24 hours ending 11/25/18 0940  Intake/Output this shift: No intake/output data recorded.  Labs: Recent Labs    11/24/18 0414 11/25/18 0550  HGB 12.1 10.9*   Recent Labs    11/24/18 0414 11/25/18 0550  WBC 17.1* 12.5*  RBC 4.59 4.07  HCT 39.1 34.1*  PLT 236 199   Recent Labs    11/24/18 0414 11/25/18 0550  NA 139 140  K 3.6 3.5  CL 104 108  CO2 24 28  BUN 11 17  CREATININE 0.80 0.64  GLUCOSE 145* 109*  CALCIUM 8.6* 8.0*   No results for input(s): LABPT, INR in the last 72 hours.   EXAM General - Patient is Alert, Appropriate and Oriented Extremity - ABD soft Sensation intact distally Intact pulses distally Dorsiflexion/Plantar flexion intact Incision: dressing C/D/I No cellulitis present Dressing/Incision - clean, dry, no drainage Motor Function - intact, moving foot and toes well on exam.   Past Medical History:  Diagnosis Date  . Hyperlipidemia   . Hypertension   . Osteoarthritis of right knee    Assessment/Plan: 2 Days Post-Op Procedure(s) (LRB): TOTAL KNEE ARTHROPLASTY-RIGHT INTERPRETER APPOINTMENT (Right) Active Problems:   Status post total knee replacement using cement,  right  Estimated body mass index is 32.61 kg/m as calculated from the following:   Height as of this encounter: 5\' 4"  (1.626 m).   Weight as of this encounter: 86.2 kg. Advance diet Up with therapy D/C IV fluids when tolerating po intake.  Labs reviewed this AM, WBC down to 12.5.  CBC and BMP ordered for tomorrow morning. Denies any urinary frequency, SOB or chest pain. Up with therapy today.  Reports worsening pain today.   Will obtain US of the right leg. Patient is passing gas without pain this AM.  Has not had a BM. Plan for discharge home tomorrow.  DVT Prophylaxis - Lovenox, Foot Pumps and TED hose Weight-Bearing as tolerated to right leg  J. Horris Latino, PA-C [facility] 11/25/2018, 9:40 AM

## 2018-11-26 LAB — CBC
HCT: 34.8 % — ABNORMAL LOW (ref 36.0–46.0)
Hemoglobin: 11.3 g/dL — ABNORMAL LOW (ref 12.0–15.0)
MCH: 26.8 pg (ref 26.0–34.0)
MCHC: 32.5 g/dL (ref 30.0–36.0)
MCV: 82.7 fL (ref 80.0–100.0)
Platelets: 203 10*3/uL (ref 150–400)
RBC: 4.21 MIL/uL (ref 3.87–5.11)
RDW: 15.2 % (ref 11.5–15.5)
WBC: 12.5 10*3/uL — ABNORMAL HIGH (ref 4.0–10.5)
nRBC: 0 % (ref 0.0–0.2)

## 2018-11-26 LAB — BASIC METABOLIC PANEL
Anion gap: 5 (ref 5–15)
BUN: 13 mg/dL (ref 8–23)
CO2: 31 mmol/L (ref 22–32)
Calcium: 8.1 mg/dL — ABNORMAL LOW (ref 8.9–10.3)
Chloride: 102 mmol/L (ref 98–111)
Creatinine, Ser: 0.62 mg/dL (ref 0.44–1.00)
GFR calc Af Amer: >60 mL/min (ref >60)
GFR calc non Af Amer: >60 mL/min (ref >60)
Glucose, Bld: 132 mg/dL — ABNORMAL HIGH (ref 70–99)
Potassium: 3.5 mmol/L (ref 3.5–5.1)
Sodium: 138 mmol/L (ref 135–145)

## 2018-11-26 NOTE — Progress Notes (Signed)
DISCHARGE NOTE:  Spanish interpreter at bedside. Pt given discharge instructions and scripts (lovenox, oxycodone, tramadol). Pt verbalized understanding. Pts home equipment sent with pt. Pt wheeled to car by staff. Family providing transportation.

## 2018-11-26 NOTE — Progress Notes (Signed)
Physical Therapy Treatment Patient Details Name: Rose Roberts MRN: [id number] DOB: 10/29/1950 Today's Date: 11/26/2018    History of Present Illness Patient is 68 yo female s/p R TKA, WBAT. PMH of HTN, HLD.    PT Comments    A remote Spanish interpreter was used during this treatment.  Pt in restroom upon PT arrival and able to navigate restroom and room with close supervision. Pt demonstrated improved gait mechanics today and improved gait speed.  She was able to demonstrate understanding of LE there ex, though still requiring some manual assistance for AAROM with SLR and SAQ.  Pt expressed understanding of car transfers, management of HEP and importance of frequent movement and was open to all education.  Pt will continue to benefit from skilled PT with focus on strength, safe functional mobility, tolerance to activity and pain management.  Follow Up Recommendations  Home health PT     Equipment Recommendations  Rolling walker with 5" wheels;3in1 (PT)    Recommendations for Other Services       Precautions / Restrictions Precautions Precautions: Fall Restrictions Weight Bearing Restrictions: Yes RLE Weight Bearing: Weight bearing as tolerated    Mobility  Bed Mobility Overal bed mobility: Needs Assistance Bed Mobility: Sit to Supine       Sit to supine: Min assist   General bed mobility comments: To bring R LE over EOB  Transfers Overall transfer level: Needs assistance Equipment used: Rolling walker (2 wheeled)   Sit to Stand: Min guard         General transfer comment: Good use of UE's and RW  Ambulation/Gait Ambulation/Gait assistance: Supervision Gait Distance (Feet): 30 Feet Assistive device: Rolling walker (2 wheeled)     Gait velocity interpretation: <1.8 ft/sec, indicate of risk for recurrent falls General Gait Details: More reciprocal gait pattern today, still slower and antalgic   Stairs             Wheelchair Mobility    Modified  Rankin (Stroke Patients Only)       Balance Overall balance assessment: Needs assistance Sitting-balance support: Feet supported Sitting balance-Leahy Scale: Fair     Standing balance support: Bilateral upper extremity supported Standing balance-Leahy Scale: Fair                              Cognition Arousal/Alertness: Awake/alert Behavior During Therapy: WFL for tasks assessed/performed Overall Cognitive Status: Within Functional Limits for tasks assessed                                        Exercises Total Joint Exercises Ankle Circles/Pumps: AROM;Both;10 reps Quad Sets: AROM;Right;Strengthening;Supine;10 reps Towel Squeeze: Both;Supine;10 reps Short Arc Quad: AROM;Right;Strengthening;10 reps Heel Slides: AAROM;Strengthening;Right;Supine;10 reps Hip ABduction/ADduction: AROM;Right;10 reps Straight Leg Raises: AAROM;Right;10 reps;Supine Other Exercises Other Exercises: Education regarding pain science and importance of frequent movement, management of HEP x4 min Other Exercises: Education regarding car transfers x2 min    General Comments        Pertinent Vitals/Pain Faces Pain Scale: Hurts little more Pain Location: R knee Pain Intervention(s): Monitored during session;Limited activity within patient's tolerance    Home Living                      Prior Function            PT Goals (  current goals can now be found in the care plan section) Acute Rehab PT Goals Patient Stated Goal: Patient wants to decrease pain PT Goal Formulation: With patient Time For Goal Achievement: 12/08/18 Potential to Achieve Goals: Good Progress towards PT goals: Progressing toward goals    Frequency    BID      PT Plan Current plan remains appropriate    Co-evaluation              AM-PAC PT "6 Clicks" Mobility   Outcome Measure  Help needed turning from your back to your side while in a flat bed without using bedrails?: A  Little Help needed moving from lying on your back to sitting on the side of a flat bed without using bedrails?: A Little Help needed moving to and from a bed to a chair (including a wheelchair)?: A Little Help needed standing up from a chair using your arms (e.g., wheelchair or bedside chair)?: A Little Help needed to walk in hospital room?: A Little Help needed climbing 3-5 steps with a railing? : A Little 6 Click Score: 18    End of Session Equipment Utilized During Treatment: Gait belt Activity Tolerance: Patient limited by pain Patient left: in bed;with call bell/phone within reach;with bed alarm set;with family/visitor present Nurse Communication: Mobility status PT Visit Diagnosis: Muscle weakness (generalized) (M62.81);Pain Pain - Right/Left: Right Pain - part of body: Knee     Time: 7026-3785 PT Time Calculation (min) (ACUTE ONLY): 19 min  Charges:  $Therapeutic Activity: 8-22 mins                    Glenetta Hew, PT, DPT    Glenetta Hew 11/26/2018, 11:48 AM

## 2018-11-26 NOTE — Discharge Summary (Signed)
Physician Discharge Summary  Patient ID: Rose Roberts MRN: [id number] DOB/AGE: 70/06/1951 70 y.o.  Admit date: 11/23/2018 Discharge date: 11/26/2018  Admission Diagnoses:  Primary osteoarthritis of the right knee.  Discharge Diagnoses: Patient Active Problem List   Diagnosis Date Noted  . Status post total knee replacement using cement, right 11/23/2018  . Varicose veins of bilateral lower extremities with pain 09/30/2016  . Chronic venous insufficiency 09/30/2016  . Pain in limb 09/30/2016    Past Medical History:  Diagnosis Date  . Hyperlipidemia   . Hypertension   . Osteoarthritis of right knee      Transfusion: None.   Consultants (if any):   Discharged Condition: Improved  Hospital Course: Rose Roberts is an 68 y.o. female who was admitted 11/23/2018 with a diagnosis of primary osteoarthritis of the right knee and went to the operating room on 11/23/2018 and underwent the above named procedures.    Surgeries: Procedure(s): TOTAL KNEE ARTHROPLASTY-RIGHT INTERPRETER APPOINTMENT on 11/23/2018 Patient tolerated the surgery well. Taken to PACU where she was stabilized and then transferred to the orthopedic floor.  Started on Lovenox 40mg  q 24 hrs. Foot pumps applied bilaterally at 80 mm. Heels elevated on bed with rolled towels. No evidence of DVT. Negative Homan. Physical therapy started on day #1 for gait training and transfer. OT started day #1 for ADL and assisted devices.  Patient's IV was removed on POD3, Foley removed on POD1.  Implants: Right TKA using all-cemented Biomet Vanguard system with a 65 mm PCR femur, a 71 mm tibial tray with a 10 mm anterior stabilized e-poly insert, and a 31 x 8 mm all-poly 3-pegged domed patella.  She was given perioperative antibiotics:  Anti-infectives (From admission, onward)   Start     Dose/Rate Route Frequency Ordered Stop   11/23/18 1800  ceFAZolin (ANCEF) IVPB 2g/100 mL premix     2 g 200 mL/hr over 30 Minutes Intravenous  Every 6 hours 11/23/18 1717 11/24/18 0727   11/23/18 0841  ceFAZolin (ANCEF) 2-4 GM/100ML-% IVPB    Note to Pharmacy:  Haizlip, Candace   : cabinet override      11/23/18 0841 11/23/18 1042   11/22/18 2100  ceFAZolin (ANCEF) IVPB 2g/100 mL premix     2 g 200 mL/hr over 30 Minutes Intravenous  Once 11/22/18 2054 11/23/18 1052    .  She was given sequential compression devices, early ambulation, and Lovenox for DVT prophylaxis.  She benefited maximally from the hospital stay and there were no complications.    Recent vital signs:  Vitals:   11/25/18 2325 11/26/18 0805  BP: (!) 165/72 (!) 142/68  Pulse: 92 83  Resp:    Temp: 99.9 F (37.7 C) 99 F (37.2 C)  SpO2: 94% 97%    Recent laboratory studies:  Lab Results  Component Value Date   HGB 11.3 (L) 11/26/2018   HGB 10.9 (L) 11/25/2018   HGB 12.1 11/24/2018   Lab Results  Component Value Date   WBC 12.5 (H) 11/26/2018   PLT 203 11/26/2018   No results found for: INR Lab Results  Component Value Date   NA 138 11/26/2018   K 3.5 11/26/2018   CL 102 11/26/2018   CO2 31 11/26/2018   BUN 13 11/26/2018   CREATININE 0.62 11/26/2018   GLUCOSE 132 (H) 11/26/2018    Discharge Medications:   Allergies as of 11/26/2018   No Known Allergies     Medication List    TAKE these medications  atorvastatin 20 MG tablet Commonly known as:  LIPITOR Take 20 mg by mouth at bedtime.   enoxaparin 40 MG/0.4ML injection Commonly known as:  LOVENOX Inject 0.4 mLs (40 mg total) into the skin daily.   hydrochlorothiazide 12.5 MG tablet Commonly known as:  HYDRODIURIL Take 12.5 mg by mouth daily.   meloxicam 15 MG tablet Commonly known as:  MOBIC Take 15 mg by mouth daily.   oxyCODONE 5 MG immediate release tablet Commonly known as:  Oxy IR/ROXICODONE Take 1-2 tablets (5-10 mg total) by mouth every 4 (four) hours as needed for moderate pain (pain score 4-6).   traMADol 50 MG tablet Commonly known as:  ULTRAM Take 1 tablet  (50 mg total) by mouth every 6 (six) hours as needed for moderate pain.   triamcinolone cream 0.1 % Commonly known as:  KENALOG Apply 1 application topically daily as needed (itching).            Durable Medical Equipment  (From admission, onward)         Start     Ordered   11/23/18 1717  DME Bedside commode  Once    Question:  Patient needs a bedside commode to treat with the following condition  Answer:  Status post total knee replacement using cement, right   11/23/18 1717   11/23/18 1717  DME 3 n 1  Once     11/23/18 1717   11/23/18 1717  DME Walker rolling  Once    Question:  Patient needs a walker to treat with the following condition  Answer:  Status post total knee replacement using cement, right   11/23/18 1717          Diagnostic Studies: US Venous Img Lower Unilateral Right  Result Date: 11/25/2018 CLINICAL DATA:  68 year old female with right lower extremity pain and swelling. Recent right knee replacement on 11/23/2018. EXAM: RIGHT LOWER EXTREMITY VENOUS DOPPLER ULTRASOUND TECHNIQUE: Gray-scale sonography with graded compression, as well as color Doppler and duplex ultrasound were performed to evaluate the lower extremity deep venous systems from the level of the common femoral vein and including the common femoral, femoral, profunda femoral, popliteal and calf veins including the posterior tibial, peroneal and gastrocnemius veins when visible. The superficial great saphenous vein was also interrogated. Spectral Doppler was utilized to evaluate flow at rest and with distal augmentation maneuvers in the common femoral, femoral and popliteal veins. COMPARISON:  None. FINDINGS: Contralateral Common Femoral Vein: Respiratory phasicity is normal and symmetric with the symptomatic side. No evidence of thrombus. Normal compressibility. Common Femoral Vein: No evidence of thrombus. Normal compressibility, respiratory phasicity and response to augmentation. Saphenofemoral  Junction: No evidence of thrombus. Normal compressibility and flow on color Doppler imaging. Profunda Femoral Vein: No evidence of thrombus. Normal compressibility and flow on color Doppler imaging. Femoral Vein: No evidence of thrombus. Normal compressibility, respiratory phasicity and response to augmentation. Popliteal Vein: No evidence of thrombus. Normal compressibility, respiratory phasicity and response to augmentation. Calf Veins: No evidence of thrombus. Normal compressibility and flow on color Doppler imaging. Superficial Great Saphenous Vein: No evidence of thrombus. Normal compressibility. Venous Reflux:  None. Other Findings:  None. IMPRESSION: No evidence of deep venous thrombosis. Electronically Signed   By: Malachy Moan M.D.   On: 11/25/2018 11:12   Dg Knee Right Port  Result Date: 11/23/2018 CLINICAL DATA:  Status post total knee replacement. EXAM: PORTABLE RIGHT KNEE - 1-2 VIEW COMPARISON:  None. FINDINGS: Right knee prosthetic components appear well seated and  well aligned. There is no acute fracture or evidence of an operative complication. IMPRESSION: Well-positioned right knee total arthroplasty. Electronically Signed   By: Amie Portlandavid  Ormond M.D.   On: 11/23/2018 13:20   [place] Breast Ltd Uni Left Inc Axilla  Result Date: 11/13/2018 CLINICAL DATA:  The patient was called back for a left breast mass EXAM: DIGITAL DIAGNOSTIC LEFT MAMMOGRAM WITH CAD AND TOMO ULTRASOUND LEFT BREAST COMPARISON:  Previous exam(s). ACR Breast Density Category b: There are scattered areas of fibroglandular density. FINDINGS: The mass in the upper outer left breast persists on additional imaging. Mammographic images were processed with CAD. On physical exam, no suspicious lumps are identified. Targeted ultrasound is performed, showing a dominant cyst in the left breast at 2 o'clock, 4 cm from the nipple explaining the mammographic findings. Multiple smaller surrounding cysts are seen as well. IMPRESSION: Fibrocystic  changes.  No evidence of malignancy in the left breast. RECOMMENDATION: Annual screening mammography. I have discussed the findings and recommendations with the patient. Results were also provided in writing at the conclusion of the visit. If applicable, a reminder letter will be sent to the patient regarding the next appointment. BI-RADS CATEGORY  2: Benign. Electronically Signed   By: Gerome Samavid  Williams III M.D   On: 11/13/2018 10:32   Mm Diag Breast Tomo Uni Left  Result Date: 11/13/2018 CLINICAL DATA:  The patient was called back for a left breast mass EXAM: DIGITAL DIAGNOSTIC LEFT MAMMOGRAM WITH CAD AND TOMO ULTRASOUND LEFT BREAST COMPARISON:  Previous exam(s). ACR Breast Density Category b: There are scattered areas of fibroglandular density. FINDINGS: The mass in the upper outer left breast persists on additional imaging. Mammographic images were processed with CAD. On physical exam, no suspicious lumps are identified. Targeted ultrasound is performed, showing a dominant cyst in the left breast at 2 o'clock, 4 cm from the nipple explaining the mammographic findings. Multiple smaller surrounding cysts are seen as well. IMPRESSION: Fibrocystic changes.  No evidence of malignancy in the left breast. RECOMMENDATION: Annual screening mammography. I have discussed the findings and recommendations with the patient. Results were also provided in writing at the conclusion of the visit. If applicable, a reminder letter will be sent to the patient regarding the next appointment. BI-RADS CATEGORY  2: Benign. Electronically Signed   By: Gerome Samavid  Williams III M.D   On: 11/13/2018 10:32   Disposition: Plan for discharge home this afternoon.  Follow-up Information    Anson OregonMcGhee, James Lance, PA-C. Go on 12/08/2018.   Specialty:  Physician Assistant Why:  9:15 AM Contact information: [address] [facility] [place] [id number] [phone]          Signed: Meriel PicaJames L McGhee Pa-C 11/26/2018, 9:05  AM

## 2018-11-26 NOTE — Progress Notes (Signed)
Subjective: 3 Days Post-Op Procedure(s) (LRB): TOTAL KNEE ARTHROPLASTY-RIGHT INTERPRETER APPOINTMENT (Right) Pain much improved today compared to yesterday. Patient is well, and has had no acute complaints or problems Plan for discharge home with HHPT today. Negative for chest pain and shortness of breath Fever: no Gastrointestinal:Negative for nausea and vomiting Interpretation provided by interpreter, and son was present for visit.  Objective: Vital signs in last 24 hours: Temp:  [98.7 F (37.1 C)-99.9 F (37.7 C)] 99 F (37.2 C) (02/09 0805) Pulse Rate:  [72-92] 83 (02/09 0805) BP: (142-165)/(66-72) 142/68 (02/09 0805) SpO2:  [94 %-97 %] 97 % (02/09 0805)  Intake/Output from previous day:  Intake/Output Summary (Last 24 hours) at 11/26/2018 0903 Last data filed at 11/25/2018 1906 Gross per 24 hour  Intake 240 ml  Output -  Net 240 ml    Intake/Output this shift: No intake/output data recorded.  Labs: Recent Labs    11/24/18 0414 11/25/18 0550 11/26/18 0351  HGB 12.1 10.9* 11.3*   Recent Labs    11/25/18 0550 11/26/18 0351  WBC 12.5* 12.5*  RBC 4.07 4.21  HCT 34.1* 34.8*  PLT 199 203   Recent Labs    11/25/18 0550 11/26/18 0351  NA 140 138  K 3.5 3.5  CL 108 102  CO2 28 31  BUN 17 13  CREATININE 0.64 0.62  GLUCOSE 109* 132*  CALCIUM 8.0* 8.1*   No results for input(s): LABPT, INR in the last 72 hours.   EXAM General - Patient is Alert, Appropriate and Oriented Extremity - ABD soft Sensation intact distally Intact pulses distally Dorsiflexion/Plantar flexion intact Incision: dressing C/D/I No cellulitis present Dressing/Incision - clean, dry, no drainage Motor Function - intact, moving foot and toes well on exam.   Past Medical History:  Diagnosis Date  . Hyperlipidemia   . Hypertension   . Osteoarthritis of right knee    Assessment/Plan: 3 Days Post-Op Procedure(s) (LRB): TOTAL KNEE ARTHROPLASTY-RIGHT INTERPRETER APPOINTMENT  (Right) Active Problems:   Status post total knee replacement using cement, right  Estimated body mass index is 32.61 kg/m as calculated from the following:   Height as of this encounter: 5\' 4"  (1.626 m).   Weight as of this encounter: 86.2 kg. Advance diet Up with therapy D/C IV fluids when tolerating po intake.  Labs reviewed this AM.  Mild fever last night. Denies any urinary frequency, SOB or chest pain. Patient has cleared all steps with PT. US of the right leg negative for DVT. Patient has had a BM. Plan for discharge home today.  DVT Prophylaxis - Lovenox, Foot Pumps and TED hose Weight-Bearing as tolerated to right leg  J. Horris Latino, PA-C [facility] 11/26/2018, 9:03 AM

## 2018-11-26 NOTE — Care Management Note (Signed)
Case Management Note  Patient Details  Name: Rose Roberts MRN: [id number] Date of Birth: 07/18/1951  Subjective/Objective:  Patient to be discharged per MD order. Orders in place for home health services. Previous RNCM had worked up home health via [facility]. Referral confirmed with [name] for PT services. Patient had rolling walker and bedside commode delivered by [facility]. No further needs. Family to transport.                   Action/Plan:   Expected Discharge Date:  11/26/18               Expected Discharge Plan:     In-House Referral:     Discharge planning Services  CM Consult  Post Acute Care Choice:  Home Health, Durable Medical Equipment Choice offered to:  Patient, Adult Children  DME Arranged:  3-N-1, Walker rolling DME Agency:  [facility]  HH Arranged:  PT HH Agency:  [facility]  Status of Service:  In process, will continue to follow  If discussed at Long Length of Stay Meetings, dates discussed:    Additional Comments:  Edwinna Areola Rose Pretlow, RN 11/26/2018, 9:10 AM

## 2019-06-13 ENCOUNTER — Other Ambulatory Visit: Payer: Self-pay

## 2019-06-13 ENCOUNTER — Encounter
Admission: RE | Admit: 2019-06-13 | Discharge: 2019-06-13 | Disposition: A | Discharge status: Home / Self Care | Payer: Medicare Other | Source: Ambulatory Visit | Attending: Surgery | Admitting: Surgery

## 2019-06-13 DIAGNOSIS — Z20828 Contact with and (suspected) exposure to other viral communicable diseases: Secondary | ICD-10-CM | POA: Diagnosis not present

## 2019-06-13 DIAGNOSIS — I10 Essential (primary) hypertension: Secondary | ICD-10-CM | POA: Insufficient documentation

## 2019-06-13 DIAGNOSIS — Z01818 Encounter for other preprocedural examination: Secondary | ICD-10-CM | POA: Insufficient documentation

## 2019-06-13 HISTORY — DX: Gastro-esophageal reflux disease without esophagitis: K21.9

## 2019-06-13 LAB — URINALYSIS, COMPLETE (UACMP) WITH MICROSCOPIC
Bacteria, UA: NONE SEEN
Bilirubin Urine: NEGATIVE
Glucose, UA: NEGATIVE mg/dL
Hgb urine dipstick: NEGATIVE
Ketones, ur: NEGATIVE mg/dL
Leukocytes,Ua: NEGATIVE
Nitrite: NEGATIVE
Protein, ur: NEGATIVE mg/dL
Specific Gravity, Urine: 1.021 (ref 1.005–1.030)
pH: 5 (ref 5.0–8.0)

## 2019-06-13 LAB — BASIC METABOLIC PANEL
Anion gap: 6 (ref 5–15)
BUN: 14 mg/dL (ref 8–23)
CO2: 28 mmol/L (ref 22–32)
Calcium: 9.4 mg/dL (ref 8.9–10.3)
Chloride: 106 mmol/L (ref 98–111)
Creatinine, Ser: 0.68 mg/dL (ref 0.44–1.00)
GFR calc Af Amer: >60 mL/min (ref >60)
GFR calc non Af Amer: >60 mL/min (ref >60)
Glucose, Bld: 125 mg/dL — ABNORMAL HIGH (ref 70–99)
Potassium: 4 mmol/L (ref 3.5–5.1)
Sodium: 140 mmol/L (ref 135–145)

## 2019-06-13 LAB — CBC
HCT: 44.5 % (ref 36.0–46.0)
Hemoglobin: 14 g/dL (ref 12.0–15.0)
MCH: 26.3 pg (ref 26.0–34.0)
MCHC: 31.5 g/dL (ref 30.0–36.0)
MCV: 83.6 fL (ref 80.0–100.0)
Platelets: 262 10*3/uL (ref 150–400)
RBC: 5.32 MIL/uL — ABNORMAL HIGH (ref 3.87–5.11)
RDW: 15.2 % (ref 11.5–15.5)
WBC: 8.5 10*3/uL (ref 4.0–10.5)
nRBC: 0 % (ref 0.0–0.2)

## 2019-06-13 LAB — SURGICAL PCR SCREEN
MRSA, PCR: NEGATIVE
Staphylococcus aureus: NEGATIVE

## 2019-06-13 LAB — TYPE AND SCREEN
ABO/RH(D): A POS
Antibody Screen: NEGATIVE

## 2019-06-13 NOTE — Patient Instructions (Addendum)
Your procedure is scheduled on: 06/19/2019 Tues Report to 2nd floor medical mall To find out your arrival time please call [phone] between 1PM - 3PM on 06/18/2019 Mon  Remember: Instructions that are not followed completely may result in serious medical risk,  up to and including death, or upon the discretion of your surgeon and anesthesiologist your  surgery may need to be rescheduled.     _X__ 1. Do not eat food after midnight the night before your procedure.                 No gum chewing or hard candies. You may drink clear liquids up to 2 hours                 before you are scheduled to arrive for your surgery- DO not drink clear                 liquids within 2 hours of the start of your surgery.                 Clear Liquids include:  water, apple juice without pulp, clear carbohydrate                 drink such as Clearfast of Gatorade, Black Coffee or Tea (Do not add                 anything to coffee or tea).  __X__2.  On the morning of surgery brush your teeth with toothpaste and water, you                may rinse your mouth with mouthwash if you wish.  Do not swallow any toothpaste of mouthwash.     _X__ 3.  No Alcohol for 24 hours before or after surgery.   _X__ 4.  Do Not Smoke or use e-cigarettes For 24 Hours Prior to Your Surgery.                 Do not use any chewable tobacco products for at least 6 hours prior to                 surgery.  ____  5.  Bring all medications with you on the day of surgery if instructed.   ____  6.  Notify your doctor if there is any change in your medical condition      (cold, fever, infections).     Do not wear jewelry, make-up, hairpins, clips or nail polish. Do not wear lotions, powders, or perfumes. You may wear deodorant. Do not shave 48 hours prior to surgery. Men may shave face and neck. Do not bring valuables to the hospital.    [facility] is not [facility] any belongings or  valuables.  Contacts, dentures or bridgework may not be worn into surgery. Leave your suitcase in the car. After surgery it may be brought to your room. For patients admitted to the hospital, discharge time is determined by your treatment team.   Patients discharged the day of surgery will not be allowed to drive home.   Please read over the following fact sheets that you were given:    ____ Take these medicines the morning of surgery with A SIP OF WATER:    1. None  2.   3.   4.  5.  6.  ____ Fleet Enema (as directed)   ____ Use CHG Soap as directed  ____ Use inhalers on the  day of surgery  ____ Stop metformin 2 days prior to surgery    ____ Take 1/2 of usual insulin dose the night before surgery. No insulin the morning          of surgery.   ____ Stop Coumadin/Plavix/aspirin on   _x___ Stop Anti-inflammatories on Stop meloxicam today.   ____ Stop supplements until after surgery.    ____ Bring C-Pap to the hospital.                               [place]o   Su ciruga est programada para-(your procedure is scheduled on) 06/19/2019 [facility] 2nd floor medical mall  (enter)    Por favor llame al [phone] despus de las 12 del medioda del da anterior a su procedimiento para preguntar sobre su hora de llegada.  (Please call [phone] after 12 noon the day before your procedure to receive your arrival time.)                  Recuerde: (Remember)   No coma alimentos ni tome lquidos, incluyendo agua, despus de la medianoche del  (Do not eat food or drink liquids including water after midnight on___8/31/2020 May have clear liquids up to 2 hours before surgery.____________   Hermelinda Dellen medicinas en la manaa de la ciruga con un SORBITO de agua (take these meds the morning of surgery with a SIP of water)__________________________________   Lavena Stanford los dientes en la maana de la [place]. (you  may brush your teeth the morning of surgery)   No use joyas, maquillaje de ojos, lpiz labial, crema para el cuerpo o esmalte de uas oscuro.  (Do not wear jewelry, eye makeup, lipstick, body lotion, or dark fingernail polish)   No puede usar desodorante. (you may wear deodorant)   Si va a ser ingresado despus de la ciruga, deje la maleta en el carro hasta que se le haya asignado una habitacin. (If you are to be admitted after surgery, leave suitcase in car until your room has been assigned.)   A los pacientes que se les d de alta el mismo da no se les permitir manejar a casa.  (Patients discharged on the day of surgery will not be allowed to drive home)   Use ropa suelta y cmoda de regreso a casa. (wear loose comfortable clothes for ride home)    Gordon (patient signature) ______________________________________

## 2019-06-14 LAB — URINE CULTURE

## 2019-06-15 ENCOUNTER — Other Ambulatory Visit
Admission: RE | Admit: 2019-06-15 | Discharge: 2019-06-15 | Disposition: A | Discharge status: Home / Self Care | Payer: Medicare Other | Source: Ambulatory Visit | Attending: Surgery | Admitting: Surgery

## 2019-06-15 ENCOUNTER — Other Ambulatory Visit: Payer: Self-pay

## 2019-06-15 DIAGNOSIS — Z01818 Encounter for other preprocedural examination: Secondary | ICD-10-CM | POA: Diagnosis not present

## 2019-06-16 LAB — SARS CORONAVIRUS 2 (TAT 6-24 HRS): SARS Coronavirus 2: NEGATIVE

## 2019-06-18 MED ORDER — CEFAZOLIN SODIUM-DEXTROSE 2-4 GM/100ML-% IV SOLN
2.0000 g | Freq: Once | INTRAVENOUS | Status: AC
  Administered 2019-06-19: 2 g via INTRAVENOUS

## 2019-06-19 ENCOUNTER — Encounter: Payer: Self-pay | Admitting: *Deleted

## 2019-06-19 ENCOUNTER — Inpatient Hospital Stay: Payer: Medicare Other | Admitting: Certified Registered"

## 2019-06-19 ENCOUNTER — Other Ambulatory Visit: Payer: Self-pay

## 2019-06-19 ENCOUNTER — Inpatient Hospital Stay: Payer: Medicare Other

## 2019-06-19 ENCOUNTER — Inpatient Hospital Stay
Admission: RE | Admit: 2019-06-19 | Discharge: 2019-06-22 | DRG: 470 | Disposition: A | Discharge status: Home Health | Payer: Medicare Other | Attending: Surgery | Admitting: Surgery

## 2019-06-19 ENCOUNTER — Encounter
Admission: RE | Disposition: A | Discharge status: Home Health | Payer: Self-pay | Source: Ambulatory Visit | Attending: Surgery

## 2019-06-19 DIAGNOSIS — E785 Hyperlipidemia, unspecified: Secondary | ICD-10-CM | POA: Diagnosis present

## 2019-06-19 DIAGNOSIS — Z791 Long term (current) use of non-steroidal anti-inflammatories (NSAID): Secondary | ICD-10-CM

## 2019-06-19 DIAGNOSIS — K219 Gastro-esophageal reflux disease without esophagitis: Secondary | ICD-10-CM | POA: Diagnosis present

## 2019-06-19 DIAGNOSIS — I10 Essential (primary) hypertension: Secondary | ICD-10-CM | POA: Diagnosis present

## 2019-06-19 DIAGNOSIS — M79662 Pain in left lower leg: Secondary | ICD-10-CM

## 2019-06-19 DIAGNOSIS — M1712 Unilateral primary osteoarthritis, left knee: Principal | ICD-10-CM | POA: Diagnosis present

## 2019-06-19 DIAGNOSIS — Z79899 Other long term (current) drug therapy: Secondary | ICD-10-CM | POA: Diagnosis not present

## 2019-06-19 DIAGNOSIS — R509 Fever, unspecified: Secondary | ICD-10-CM

## 2019-06-19 DIAGNOSIS — Z96651 Presence of right artificial knee joint: Secondary | ICD-10-CM | POA: Diagnosis present

## 2019-06-19 DIAGNOSIS — Z96652 Presence of left artificial knee joint: Secondary | ICD-10-CM

## 2019-06-19 HISTORY — PX: TOTAL KNEE ARTHROPLASTY: SHX125

## 2019-06-19 SURGERY — ARTHROPLASTY, KNEE, TOTAL
Anesthesia: Spinal | Site: Knee | Laterality: Left

## 2019-06-19 MED ORDER — ONDANSETRON HCL 4 MG/2ML IJ SOLN: 4.0000 mg | Freq: Four times a day (QID) | INTRAMUSCULAR | Status: DC | PRN

## 2019-06-19 MED ORDER — LIDOCAINE HCL (PF) 2 % IJ SOLN
INTRAMUSCULAR | Status: AC
  Filled 2019-06-19: qty 10

## 2019-06-19 MED ORDER — ADULT MULTIVITAMIN W/MINERALS CH
1.0000 | ORAL_TABLET | Freq: Every day | ORAL | Status: DC
  Administered 2019-06-20 – 2019-06-22 (×3): 1 via ORAL
  Filled 2019-06-19 (×3): qty 1

## 2019-06-19 MED ORDER — ROCURONIUM BROMIDE 50 MG/5ML IV SOLN
INTRAVENOUS | Status: AC
  Filled 2019-06-19: qty 1

## 2019-06-19 MED ORDER — FENTANYL CITRATE (PF) 100 MCG/2ML IJ SOLN
INTRAMUSCULAR | Status: AC
  Filled 2019-06-19: qty 2

## 2019-06-19 MED ORDER — MIDAZOLAM HCL 2 MG/2ML IJ SOLN
INTRAMUSCULAR | Status: AC
  Filled 2019-06-19: qty 2

## 2019-06-19 MED ORDER — KETOROLAC TROMETHAMINE 15 MG/ML IJ SOLN
7.5000 mg | Freq: Four times a day (QID) | INTRAMUSCULAR | Status: AC
  Administered 2019-06-19 – 2019-06-20 (×3): 7.5 mg via INTRAVENOUS
  Filled 2019-06-19 (×3): qty 1

## 2019-06-19 MED ORDER — KETOROLAC TROMETHAMINE 15 MG/ML IJ SOLN
15.0000 mg | Freq: Once | INTRAMUSCULAR | Status: AC
  Administered 2019-06-19: 10:00:00 15 mg via INTRAVENOUS

## 2019-06-19 MED ORDER — FAMOTIDINE 20 MG PO TABS
20.0000 mg | ORAL_TABLET | Freq: Once | ORAL | Status: AC
  Administered 2019-06-19: 07:00:00 20 mg via ORAL

## 2019-06-19 MED ORDER — FLEET ENEMA 7-19 GM/118ML RE ENEM: 1.0000 | ENEMA | Freq: Once | RECTAL | Status: DC | PRN

## 2019-06-19 MED ORDER — OXYCODONE HCL 5 MG PO TABS
5.0000 mg | ORAL_TABLET | ORAL | Status: DC | PRN
  Administered 2019-06-19 – 2019-06-20 (×3): 5 mg via ORAL
  Administered 2019-06-20 – 2019-06-21 (×3): 10 mg via ORAL
  Administered 2019-06-21 – 2019-06-22 (×2): 5 mg via ORAL
  Administered 2019-06-22: 10 mg via ORAL
  Filled 2019-06-19: qty 1
  Filled 2019-06-19 (×3): qty 2
  Filled 2019-06-19 (×4): qty 1
  Filled 2019-06-19: qty 2

## 2019-06-19 MED ORDER — BUPIVACAINE-EPINEPHRINE (PF) 0.5% -1:200000 IJ SOLN
INTRAMUSCULAR | Status: DC | PRN
  Administered 2019-06-19: 30 mL

## 2019-06-19 MED ORDER — BUPIVACAINE LIPOSOME 1.3 % IJ SUSP
INTRAMUSCULAR | Status: AC
  Filled 2019-06-19: qty 20

## 2019-06-19 MED ORDER — ATORVASTATIN CALCIUM 20 MG PO TABS
20.0000 mg | ORAL_TABLET | Freq: Every day | ORAL | Status: DC
  Administered 2019-06-19 – 2019-06-21 (×3): 20 mg via ORAL
  Filled 2019-06-19 (×3): qty 1

## 2019-06-19 MED ORDER — KETOROLAC TROMETHAMINE 15 MG/ML IJ SOLN
INTRAMUSCULAR | Status: AC
  Filled 2019-06-19: qty 1

## 2019-06-19 MED ORDER — CEFAZOLIN SODIUM-DEXTROSE 2-4 GM/100ML-% IV SOLN
INTRAVENOUS | Status: AC
  Filled 2019-06-19: qty 100

## 2019-06-19 MED ORDER — FENTANYL CITRATE (PF) 100 MCG/2ML IJ SOLN
INTRAMUSCULAR | Status: DC | PRN
  Administered 2019-06-19: 25 ug via INTRAVENOUS

## 2019-06-19 MED ORDER — FAMOTIDINE 20 MG PO TABS
ORAL_TABLET | ORAL | Status: AC
  Administered 2019-06-19: 07:00:00 20 mg via ORAL
  Filled 2019-06-19: qty 1

## 2019-06-19 MED ORDER — BISACODYL 10 MG RE SUPP: 10.0000 mg | Freq: Every day | RECTAL | Status: DC | PRN

## 2019-06-19 MED ORDER — TRANEXAMIC ACID 1000 MG/10ML IV SOLN
INTRAVENOUS | Status: DC | PRN
  Administered 2019-06-19: 1000 mg via TOPICAL

## 2019-06-19 MED ORDER — MIDAZOLAM HCL 2 MG/2ML IJ SOLN
INTRAMUSCULAR | Status: DC | PRN
  Administered 2019-06-19: 2 mg via INTRAVENOUS

## 2019-06-19 MED ORDER — PROPOFOL 500 MG/50ML IV EMUL
INTRAVENOUS | Status: AC
  Filled 2019-06-19: qty 50

## 2019-06-19 MED ORDER — OXYCODONE HCL 5 MG PO TABS: 5.0000 mg | ORAL_TABLET | Freq: Once | ORAL | Status: DC | PRN

## 2019-06-19 MED ORDER — FENTANYL CITRATE (PF) 100 MCG/2ML IJ SOLN: 25.0000 ug | INTRAMUSCULAR | Status: DC | PRN

## 2019-06-19 MED ORDER — TRAMADOL HCL 50 MG PO TABS
50.0000 mg | ORAL_TABLET | Freq: Four times a day (QID) | ORAL | Status: DC | PRN
  Administered 2019-06-20 – 2019-06-21 (×2): 50 mg via ORAL
  Filled 2019-06-19 (×2): qty 1

## 2019-06-19 MED ORDER — BUPIVACAINE HCL (PF) 0.5 % IJ SOLN
INTRAMUSCULAR | Status: DC | PRN
  Administered 2019-06-19: 2.5 mL via INTRATHECAL

## 2019-06-19 MED ORDER — SODIUM CHLORIDE 0.9 % IV SOLN
INTRAVENOUS | Status: DC
  Administered 2019-06-19 – 2019-06-20 (×2): via INTRAVENOUS

## 2019-06-19 MED ORDER — PROPOFOL 10 MG/ML IV BOLUS
INTRAVENOUS | Status: AC
  Filled 2019-06-19: qty 20

## 2019-06-19 MED ORDER — SODIUM CHLORIDE FLUSH 0.9 % IV SOLN
INTRAVENOUS | Status: AC
  Filled 2019-06-19: qty 40

## 2019-06-19 MED ORDER — CEFAZOLIN SODIUM-DEXTROSE 2-4 GM/100ML-% IV SOLN
2.0000 g | Freq: Four times a day (QID) | INTRAVENOUS | Status: AC
  Administered 2019-06-19 – 2019-06-20 (×3): 2 g via INTRAVENOUS
  Filled 2019-06-19 (×3): qty 100

## 2019-06-19 MED ORDER — BUPIVACAINE-EPINEPHRINE (PF) 0.5% -1:200000 IJ SOLN
INTRAMUSCULAR | Status: AC
  Filled 2019-06-19: qty 30

## 2019-06-19 MED ORDER — DIPHENHYDRAMINE HCL 12.5 MG/5ML PO ELIX: 12.5000 mg | ORAL_SOLUTION | ORAL | Status: DC | PRN

## 2019-06-19 MED ORDER — ACETAMINOPHEN 500 MG PO TABS
1000.0000 mg | ORAL_TABLET | Freq: Four times a day (QID) | ORAL | Status: AC
  Administered 2019-06-19 – 2019-06-20 (×4): 1000 mg via ORAL
  Filled 2019-06-19 (×4): qty 2

## 2019-06-19 MED ORDER — HYDROCHLOROTHIAZIDE 12.5 MG PO CAPS
12.5000 mg | ORAL_CAPSULE | Freq: Every day | ORAL | Status: DC
  Administered 2019-06-20 – 2019-06-22 (×3): 12.5 mg via ORAL
  Filled 2019-06-19 (×3): qty 1

## 2019-06-19 MED ORDER — ONDANSETRON HCL 4 MG/2ML IJ SOLN
INTRAMUSCULAR | Status: AC
  Filled 2019-06-19: qty 2

## 2019-06-19 MED ORDER — PANTOPRAZOLE SODIUM 40 MG PO TBEC
40.0000 mg | DELAYED_RELEASE_TABLET | Freq: Every day | ORAL | Status: DC
  Administered 2019-06-20 – 2019-06-22 (×3): 40 mg via ORAL
  Filled 2019-06-19 (×3): qty 1

## 2019-06-19 MED ORDER — PROPOFOL 500 MG/50ML IV EMUL
INTRAVENOUS | Status: DC | PRN
  Administered 2019-06-19: 50 ug/kg/min via INTRAVENOUS

## 2019-06-19 MED ORDER — ONDANSETRON HCL 4 MG PO TABS: 4.0000 mg | ORAL_TABLET | Freq: Four times a day (QID) | ORAL | Status: DC | PRN

## 2019-06-19 MED ORDER — LACTATED RINGERS IV SOLN
INTRAVENOUS | Status: DC
  Administered 2019-06-19: 07:00:00 via INTRAVENOUS

## 2019-06-19 MED ORDER — DEXAMETHASONE SODIUM PHOSPHATE 10 MG/ML IJ SOLN
INTRAMUSCULAR | Status: AC
  Filled 2019-06-19: qty 1

## 2019-06-19 MED ORDER — METOCLOPRAMIDE HCL 10 MG PO TABS: 5.0000 mg | ORAL_TABLET | Freq: Three times a day (TID) | ORAL | Status: DC | PRN

## 2019-06-19 MED ORDER — DOCUSATE SODIUM 100 MG PO CAPS
100.0000 mg | ORAL_CAPSULE | Freq: Two times a day (BID) | ORAL | Status: DC
  Administered 2019-06-19 – 2019-06-22 (×6): 100 mg via ORAL
  Filled 2019-06-19 (×6): qty 1

## 2019-06-19 MED ORDER — HYDROMORPHONE HCL 1 MG/ML IJ SOLN: 0.2500 mg | INTRAMUSCULAR | Status: DC | PRN

## 2019-06-19 MED ORDER — OXYCODONE HCL 5 MG/5ML PO SOLN: 5.0000 mg | Freq: Once | ORAL | Status: DC | PRN

## 2019-06-19 MED ORDER — TRANEXAMIC ACID 1000 MG/10ML IV SOLN
INTRAVENOUS | Status: AC
  Filled 2019-06-19: qty 10

## 2019-06-19 MED ORDER — MAGNESIUM HYDROXIDE 400 MG/5ML PO SUSP
30.0000 mL | Freq: Every day | ORAL | Status: DC | PRN
  Administered 2019-06-20: 30 mL via ORAL
  Filled 2019-06-19: qty 30

## 2019-06-19 MED ORDER — PHENYLEPHRINE HCL (PRESSORS) 10 MG/ML IV SOLN
INTRAVENOUS | Status: AC
  Filled 2019-06-19: qty 1

## 2019-06-19 MED ORDER — ACETAMINOPHEN 325 MG PO TABS: 325.0000 mg | ORAL_TABLET | Freq: Four times a day (QID) | ORAL | Status: DC | PRN

## 2019-06-19 MED ORDER — METOCLOPRAMIDE HCL 5 MG/ML IJ SOLN: 5.0000 mg | Freq: Three times a day (TID) | INTRAMUSCULAR | Status: DC | PRN

## 2019-06-19 MED ORDER — SUCCINYLCHOLINE CHLORIDE 20 MG/ML IJ SOLN
INTRAMUSCULAR | Status: AC
  Filled 2019-06-19: qty 1

## 2019-06-19 MED ORDER — EPHEDRINE SULFATE 50 MG/ML IJ SOLN
INTRAMUSCULAR | Status: AC
  Filled 2019-06-19: qty 1

## 2019-06-19 MED ORDER — SODIUM CHLORIDE 0.9 % IV SOLN
INTRAVENOUS | Status: DC | PRN
  Administered 2019-06-19: 09:00:00 60 mL

## 2019-06-19 MED ORDER — ENOXAPARIN SODIUM 40 MG/0.4ML ~~LOC~~ SOLN
40.0000 mg | SUBCUTANEOUS | Status: DC
  Administered 2019-06-20 – 2019-06-22 (×3): 40 mg via SUBCUTANEOUS
  Filled 2019-06-19 (×3): qty 0.4

## 2019-06-19 SURGICAL SUPPLY — 65 items
BANDAGE ELASTIC 6 LF NS (GAUZE/BANDAGES/DRESSINGS) ×3 IMPLANT
BEARING TIBIAL VG AS 71X12 (Joint) ×1 IMPLANT
BIT DRILL QUICK REL 1/8 2PK SL (DRILL) ×1 IMPLANT
BLADE SAW SAG 25X90X1.19 (BLADE) ×3 IMPLANT
BLADE SURG SZ20 CARB STEEL (BLADE) ×3 IMPLANT
CANISTER SUCT 1200ML W/VALVE (MISCELLANEOUS) ×3 IMPLANT
CANISTER SUCT 3000ML PPV (MISCELLANEOUS) ×3 IMPLANT
CEMENT BONE R 1X40 (Cement) ×3 IMPLANT
CEMENT VACUUM MIXING SYSTEM (MISCELLANEOUS) ×3 IMPLANT
CHLORAPREP W/TINT 26 (MISCELLANEOUS) ×3 IMPLANT
COOLER POLAR GLACIER W/PUMP (MISCELLANEOUS) ×3 IMPLANT
COVER MAYO STAND REUSABLE (DRAPES) ×3 IMPLANT
COVER WAND RF STERILE (DRAPES) ×3 IMPLANT
CUFF TOURN SGL QUICK 24 (TOURNIQUET CUFF)
CUFF TOURN SGL QUICK 30 (TOURNIQUET CUFF)
CUFF TRNQT CYL 24X4X16.5-23 (TOURNIQUET CUFF) IMPLANT
CUFF TRNQT CYL 30X4X21-28X (TOURNIQUET CUFF) IMPLANT
DRAPE 3/4 80X56 (DRAPES) ×3 IMPLANT
DRAPE SPLIT 6X30 W/TAPE (DRAPES) ×3 IMPLANT
DRILL QUICK RELEASE 1/8 INCH (DRILL) ×2
DRSG OPSITE POSTOP 4X10 (GAUZE/BANDAGES/DRESSINGS) ×3 IMPLANT
DRSG OPSITE POSTOP 4X8 (GAUZE/BANDAGES/DRESSINGS) ×3 IMPLANT
ELECT CAUTERY BLADE 6.4 (BLADE) ×3 IMPLANT
ELECT REM PT RETURN 9FT ADLT (ELECTROSURGICAL) ×3
ELECTRODE REM PT RTRN 9FT ADLT (ELECTROSURGICAL) ×1 IMPLANT
FEMORAL CR LEFT 65MM (Joint) ×3 IMPLANT
GLOVE BIO SURGEON STRL SZ7.5 (GLOVE) ×12 IMPLANT
GLOVE BIO SURGEON STRL SZ8 (GLOVE) ×12 IMPLANT
GLOVE BIOGEL M 6.5 STRL (GLOVE) ×3 IMPLANT
GLOVE BIOGEL M STRL SZ7.5 (GLOVE) ×3 IMPLANT
GLOVE BIOGEL PI IND STRL 8 (GLOVE) ×1 IMPLANT
GLOVE BIOGEL PI INDICATOR 8 (GLOVE) ×2
GLOVE INDICATOR 8.0 STRL GRN (GLOVE) ×3 IMPLANT
GLOVE SURG SYN 6.5 ES PF (GLOVE) ×3 IMPLANT
GOWN STRL REUS W/ TWL LRG LVL3 (GOWN DISPOSABLE) ×1 IMPLANT
GOWN STRL REUS W/ TWL XL LVL3 (GOWN DISPOSABLE) ×1 IMPLANT
GOWN STRL REUS W/TWL LRG LVL3 (GOWN DISPOSABLE) ×2
GOWN STRL REUS W/TWL XL LVL3 (GOWN DISPOSABLE) ×2
HOLDER FOLEY CATH W/STRAP (MISCELLANEOUS) ×3 IMPLANT
HOOD PEEL AWAY FLYTE STAYCOOL (MISCELLANEOUS) ×12 IMPLANT
KIT TURNOVER KIT A (KITS) ×3 IMPLANT
NDL SAFETY ECLIPSE 18X1.5 (NEEDLE) ×2 IMPLANT
NEEDLE HYPO 18GX1.5 SHARP (NEEDLE) ×4
NEEDLE SPNL 20GX3.5 QUINCKE YW (NEEDLE) ×3 IMPLANT
NS IRRIG 1000ML POUR BTL (IV SOLUTION) ×3 IMPLANT
PACK TOTAL KNEE (MISCELLANEOUS) ×3 IMPLANT
PAD WRAPON POLAR KNEE (MISCELLANEOUS) ×1 IMPLANT
PATELLA SERIES A (Orthopedic Implant) ×3 IMPLANT
PLATE KNEE TIBIAL 71MM FIXED (Plate) ×3 IMPLANT
PULSAVAC PLUS IRRIG FAN TIP (DISPOSABLE) ×3
SOL .9 NS 3000ML IRR  AL (IV SOLUTION) ×2
SOL .9 NS 3000ML IRR UROMATIC (IV SOLUTION) ×1 IMPLANT
STAPLER SKIN PROX 35W (STAPLE) ×3 IMPLANT
SUCTION FRAZIER HANDLE 10FR (MISCELLANEOUS) ×2
SUCTION TUBE FRAZIER 10FR DISP (MISCELLANEOUS) ×1 IMPLANT
SUT VIC AB 0 CT1 36 (SUTURE) ×9 IMPLANT
SUT VIC AB 2-0 CT1 27 (SUTURE) ×8
SUT VIC AB 2-0 CT1 TAPERPNT 27 (SUTURE) ×4 IMPLANT
SYR 10ML LL (SYRINGE) ×9 IMPLANT
SYR 20ML LL LF (SYRINGE) ×3 IMPLANT
SYR 30ML LL (SYRINGE) ×9 IMPLANT
TIBIAL BEARING VG AS 71X12 (Joint) ×3 IMPLANT
TIP FAN IRRIG PULSAVAC PLUS (DISPOSABLE) ×1 IMPLANT
TRAY FOLEY MTR SLVR 16FR STAT (SET/KITS/TRAYS/PACK) ×3 IMPLANT
WRAPON POLAR PAD KNEE (MISCELLANEOUS) ×3

## 2019-06-19 NOTE — Anesthesia Procedure Notes (Signed)
Spinal  Patient location during procedure: OR Start time: 06/19/2019 7:41 AM End time: 06/19/2019 7:42 AM Staffing Anesthesiologist: Piscitello, Precious Haws, MD Performed: anesthesiologist  Preanesthetic Checklist Completed: patient identified, site marked, surgical consent, pre-op evaluation, timeout performed, IV checked, risks and benefits discussed and monitors and equipment checked Spinal Block Patient position: sitting Prep: ChloraPrep Patient monitoring: heart rate, continuous pulse ox, blood pressure and cardiac monitor Approach: midline Location: L3-4 Injection technique: single-shot Needle Needle type: Whitacre and Introducer  Needle gauge: 25 G Needle length: 9 cm Assessment Sensory level: T10 Additional Notes Negative paresthesia. Negative blood return. Positive free-flowing CSF. Expiration date of kit checked and confirmed. Patient tolerated procedure well, without complications.

## 2019-06-19 NOTE — TOC Benefit Eligibility Note (Signed)
Transition of Care [facility]) Benefit Eligibility Note    Patient Details  Name: Rose Roberts MRN: [id number] Date of Birth: 07-28-1951   Medication/Dose: Enoxaparin 40mg  once daily for 14 days  Covered?: Yes  Tier: Other(Tier 4)  Prescription Coverage [facility]erred [facility]acy: [facility]  Spoke with Person/Company/Phone Number:: Mikle Bosworth with [facility] [employer] at [phone]  Co-Pay: $30.77 estimated copay  Prior Approval: No  Deductible: Unmet   Dannette Barbara Phone Number: [phone] or [phone] 06/19/2019, 1:26 PM

## 2019-06-19 NOTE — Transfer of Care (Signed)
Immediate Anesthesia Transfer of Care Note  Patient: Rose Roberts  Procedure(s) Performed: TOTAL KNEE ARTHROPLASTY (Left Knee)  Patient Location: PACU  Anesthesia Type:Spinal  Level of Consciousness: awake, alert  and oriented  Airway & Oxygen Therapy: Patient Spontanous Breathing  Post-op Assessment: Report given to RN and Post -op Vital signs reviewed and stable  Post vital signs: Reviewed and stable  Last Vitals:  Vitals Value Taken Time  BP 119/62 06/19/19 1005  Temp 36.6 C 06/19/19 1005  Pulse 88 06/19/19 1006  Resp 15 06/19/19 1006  SpO2 100 % 06/19/19 1006  Vitals shown include unvalidated device data.  Last Pain:  Vitals:   06/19/19 0622  TempSrc: Oral  PainSc: 0-No pain         Complications: No apparent anesthesia complications

## 2019-06-19 NOTE — TOC Initial Note (Signed)
Transition of Care [facility]) - Initial/Assessment Note    Patient Details  Name: Rose Roberts MRN: [id number] Date of Birth: 06/13/1951  [facility] CM/SW Contact:    Su Hilt, RN Phone Number: 06/19/2019, 3:35 PM  Clinical Narrative:                    Spoke with the patient with the assistance of the Medical Interpreter She agrees to do a bed search to go to rehab for a bit She understands that none of the facilities allow visitors She also understand that she will be quarantined for 14 days  Will review bed offers once obtained     Patient Goals and CMS Choice        Expected Discharge Plan and Services           Expected Discharge Date: 06/21/19                                    Prior Living Arrangements/Services                       Activities of Daily Living Home Assistive Devices/Equipment: Kasandra Knudsen (specify quad or straight) ADL Screening (condition at time of admission) Patient's cognitive ability adequate to safely complete daily activities?: No Is the patient deaf or have difficulty hearing?: No Does the patient have difficulty seeing, even when wearing glasses/contacts?: No Does the patient have difficulty concentrating, remembering, or making decisions?: No Patient able to express need for assistance with ADLs?: No Does the patient have difficulty dressing or bathing?: No Independently performs ADLs?: Yes (appropriate for developmental age) Does the patient have difficulty walking or climbing stairs?: Yes Weakness of Legs: Left Weakness of Arms/Hands: None  Permission Sought/Granted                  Emotional Assessment              Admission diagnosis:  PRIMARY OSTEOARTHRITIS OF LEFT KNEE. Patient Active Problem List   Diagnosis Date Noted  . Status post total knee replacement using cement, left 06/19/2019  . Status post total knee replacement using cement, right 11/23/2018  . Varicose veins of  bilateral lower extremities with pain 09/30/2016  . Chronic venous insufficiency 09/30/2016  . Pain in limb 09/30/2016   PCP:  Theotis Burrow, MD Pharmacy:   [place], [place] - [address] [place] [address] Phone: [phone] Fax: [phone]     Social Determinants of Health (SDOH) Interventions    Readmission Risk Interventions No flowsheet data found.

## 2019-06-19 NOTE — H&P (Signed)
Paper H&P to be scanned into permanent record. H&P reviewed and patient re-examined. No changes. 

## 2019-06-19 NOTE — NC FL2 (Signed)
[place] LEVEL OF CARE SCREENING TOOL     IDENTIFICATION  Patient Name: Rose Roberts Birthdate: Jul 14, 1951 Sex: female Admission Date (Current Location): 06/19/2019  [place] and [place] Number:  Engineering geologist and Address:  [facility], [address] [place], [place] [address]      Provider Number: [id number]  Attending Physician Name and Address:  Corky Mull, MD  Relative Name and Phone Number:  Laquesha Holcomb Daughter [phone]    Current Level of Care: Hospital Recommended Level of Care: [place] Prior Approval Number:    Date Approved/Denied:   PASRR Number: [id number]  Discharge Plan: SNF    Current Diagnoses: Patient Active Problem List   Diagnosis Date Noted  . Status post total knee replacement using cement, left 06/19/2019  . Status post total knee replacement using cement, right 11/23/2018  . Varicose veins of bilateral lower extremities with pain 09/30/2016  . Chronic venous insufficiency 09/30/2016  . Pain in limb 09/30/2016    Orientation RESPIRATION BLADDER Height & Weight     Self, Time, Situation, Place  Normal Continent Weight: 81.6 kg Height:  5\' 4"  (162.6 cm)  BEHAVIORAL SYMPTOMS/MOOD NEUROLOGICAL BOWEL NUTRITION STATUS      Continent    AMBULATORY STATUS COMMUNICATION OF NEEDS Skin   Extensive Assist Verbally Normal, Surgical wounds                       Personal Care Assistance Level of Assistance  Dressing     Dressing Assistance: Limited assistance     Functional Limitations Info  Speech     Speech Info: Impaired(spanish Speaking)    SPECIAL CARE FACTORS FREQUENCY  PT (By licensed PT)     PT Frequency: 5 times              Contractures Contractures Info: Not present    Additional Factors Info  Code Status, Allergies Code Status Info: Full Allergies Info: NKDA           Current Medications (06/19/2019):  This is the current hospital  active medication list Current Facility-Administered Medications  Medication Dose Route Frequency Provider Last Rate Last Dose  . 0.9 %  sodium chloride infusion   Intravenous Continuous Poggi, Marshall Cork, MD 75 mL/hr at 06/19/19 1208    . acetaminophen (TYLENOL) tablet 1,000 mg  1,000 mg Oral Q6H Poggi, Marshall Cork, MD   1,000 mg at 06/19/19 1238  . [START ON 06/20/2019] acetaminophen (TYLENOL) tablet 325-650 mg  325-650 mg Oral Q6H PRN Poggi, Marshall Cork, MD      . atorvastatin (LIPITOR) tablet 20 mg  20 mg Oral QHS Poggi, Marshall Cork, MD      . bisacodyl (DULCOLAX) suppository 10 mg  10 mg Rectal Daily PRN Poggi, Marshall Cork, MD      . ceFAZolin (ANCEF) IVPB 2g/100 mL premix  2 g Intravenous Q6H Poggi, Marshall Cork, MD 200 mL/hr at 06/19/19 1342 2 g at 06/19/19 1342  . diphenhydrAMINE (BENADRYL) 12.5 MG/5ML elixir 12.5-25 mg  12.5-25 mg Oral Q4H PRN Poggi, Marshall Cork, MD      . docusate sodium (COLACE) capsule 100 mg  100 mg Oral BID Poggi, Marshall Cork, MD      . Derrill Memo ON 06/20/2019] enoxaparin (LOVENOX) injection 40 mg  40 mg Subcutaneous Q24H Poggi, Marshall Cork, MD      . hydrochlorothiazide (MICROZIDE) capsule 12.5 mg  12.5 mg Oral Q lunch Poggi, Marshall Cork,  MD      . HYDROmorphone (DILAUDID) injection 0.25-0.5 mg  0.25-0.5 mg Intravenous Q4H PRN Poggi, Excell SeltzerJohn J, MD      . ketorolac (TORADOL) 15 MG/ML injection 7.5 mg  7.5 mg Intravenous Q6H Poggi, Excell SeltzerJohn J, MD      . ketorolac (TORADOL) 15 MG/ML injection           . magnesium hydroxide (MILK OF MAGNESIA) suspension 30 mL  30 mL Oral Daily PRN Poggi, Excell SeltzerJohn J, MD      . metoCLOPramide (REGLAN) tablet 5-10 mg  5-10 mg Oral Q8H PRN Poggi, Excell SeltzerJohn J, MD       Or  . metoCLOPramide (REGLAN) injection 5-10 mg  5-10 mg Intravenous Q8H PRN Poggi, Excell SeltzerJohn J, MD      . multivitamin with minerals tablet 1 tablet  1 tablet Oral Daily Poggi, Excell SeltzerJohn J, MD      . ondansetron (ZOFRAN) tablet 4 mg  4 mg Oral Q6H PRN Poggi, Excell SeltzerJohn J, MD       Or  . ondansetron (ZOFRAN) injection 4 mg  4 mg Intravenous Q6H PRN Poggi,  Excell SeltzerJohn J, MD      . oxyCODONE (Oxy IR/ROXICODONE) immediate release tablet 5-10 mg  5-10 mg Oral Q4H PRN Poggi, Excell SeltzerJohn J, MD   5 mg at 06/19/19 1530  . pantoprazole (PROTONIX) EC tablet 40 mg  40 mg Oral Daily Poggi, Excell SeltzerJohn J, MD      . sodium phosphate (FLEET) 7-19 GM/118ML enema 1 enema  1 enema Rectal Once PRN Poggi, Excell SeltzerJohn J, MD      . traMADol Janean Sark(ULTRAM) tablet 50 mg  50 mg Oral Q6H PRN Poggi, Excell SeltzerJohn J, MD         Discharge Medications: Please see discharge summary for a list of discharge medications.  Relevant Imaging Results:  Relevant Lab Results:   Additional Information [id number]  Barrie Dunkereliliah J Teryn Boerema, RN

## 2019-06-19 NOTE — Evaluation (Signed)
Physical Therapy Evaluation Patient Details Name: Rose CongressBerta A Whitenight MRN: [id number] DOB: 06/29/1951 Today's Date: 06/19/2019   History of Present Illness  Patient is 68 yo female s/p L TKA, WBAT. PMH of HTN, HLD.  Clinical Impression  Pt up in bed, pleasant. Spanish interpreter utilized throughout session, pt endorsed 6-7/10 pain in L knee, and mostly returned full light touch sensation. Pt reported living in a one story home, and will have granddaugthers available to assist 24/7. Previously ambulated with SPC.   The patient exhibited significant pain signs/symptoms with bed level therapeutic exercises, AAROM, verbal cues and encouragement provided throughout. Supine <> sit minA for LLE management and HOB elevated. Pt with poor tolerance sitting EOB due to significant pain, and reliant on UE support; sat ~114minutes. Returned to supine and repositioned to optimize comfort, RN in room to administer pain medication.  Overall the patient demonstrated deficits (see "PT Problem List") that impede the patient's functional abilities, safety, and mobility and would benefit from skilled PT intervention. Recommendation is STR pending pt progress.     Follow Up Recommendations SNF    Equipment Recommendations  None recommended by PT;Other (comment)(PT has RW at home)    Recommendations for Other Services OT consult     Precautions / Restrictions Precautions Precautions: Fall Restrictions Weight Bearing Restrictions: Yes LLE Weight Bearing: Weight bearing as tolerated      Mobility  Bed Mobility Overal bed mobility: Needs Assistance Bed Mobility: Sit to Supine;Supine to Sit     Supine to sit: Min assist Sit to supine: Min assist   General bed mobility comments: minA for LLE management, cues to improve pt positioning  in bed, pt able to scoot towards head of bed in supine  Transfers                 General transfer comment: deferred due to pt significant pain  Ambulation/Gait                 Stairs            Wheelchair Mobility    Modified Rankin (Stroke Patients Only)       Balance Overall balance assessment: Needs assistance Sitting-balance support: Feet supported;Bilateral upper extremity supported Sitting balance-Leahy Scale: Poor                                       Pertinent Vitals/Pain Pain Assessment: 0-10 Pain Score: 7  Pain Location: L knee at start of session, unable to quantify pain when sitting up but exhibited significant pain signs/symptoms Pain Descriptors / Indicators: Guarding;Grimacing;Crying Pain Intervention(s): Limited activity within patient's tolerance;Monitored during session;Repositioned;RN gave pain meds during session;Ice applied    Home Living Family/patient expects to be discharged to:: Private residence Living Arrangements: Other relatives(granddaughters) Available Help at Discharge: Family;Available 24 hours/day Type of Home: House Home Access: Stairs to enter Entrance Stairs-Rails: Right;Left;Can reach both Entrance Stairs-Number of Steps: 2 Home Layout: One level Home Equipment: Walker - 2 wheels;Cane - single point      Prior Function Level of Independence: Independent with assistive device(s)         Comments: PT uses SPC to ambulate at baseline     Hand Dominance        Extremity/Trunk Assessment   Upper Extremity Assessment Upper Extremity Assessment: Overall WFL for tasks assessed    Lower Extremity Assessment Lower Extremity Assessment: RLE deficits/detail;LLE deficits/detail RLE Deficits /  Details: WFLs LLE Deficits / Details: s/p TKA       Communication   Communication: Interpreter utilized(spanish)  Cognition Arousal/Alertness: Awake/alert Behavior During Therapy: WFL for tasks assessed/performed Overall Cognitive Status: Within Functional Limits for tasks assessed                                        General Comments       Exercises Total Joint Exercises Ankle Circles/Pumps: AROM;Strengthening;Both;AAROM;5 reps Quad Sets: AROM;Strengthening;5 reps;Left Heel Slides: AAROM;Left;5 reps Hip ABduction/ADduction: AAROM;5 reps;Strengthening Straight Leg Raises: AAROM;Left;5 reps;Strengthening Goniometric ROM: -5 unable to assess flexion due to significant pain and PT assist for movement, ~45deg   Assessment/Plan    PT Assessment Patient needs continued PT services  PT Problem List Decreased strength;Decreased mobility;Decreased range of motion;Decreased knowledge of precautions;Decreased activity tolerance;Decreased balance;Pain;Decreased knowledge of use of DME       PT Treatment Interventions DME instruction;Therapeutic exercise;Gait training;Balance training;Stair training;Neuromuscular re-education;Functional mobility training;Therapeutic activities;Patient/family education    PT Goals (Current goals can be found in the Care Plan section)  Acute Rehab PT Goals Patient Stated Goal: to go home PT Goal Formulation: With patient Time For Goal Achievement: 07/03/19 Potential to Achieve Goals: Fair    Frequency BID   Barriers to discharge        Co-evaluation               AM-PAC PT "6 Clicks" Mobility  Outcome Measure Help needed turning from your back to your side while in a flat bed without using bedrails?: A Lot Help needed moving from lying on your back to sitting on the side of a flat bed without using bedrails?: A Lot Help needed moving to and from a bed to a chair (including a wheelchair)?: A Lot Help needed standing up from a chair using your arms (e.g., wheelchair or bedside chair)?: Total Help needed to walk in hospital room?: Total Help needed climbing 3-5 steps with a railing? : Total 6 Click Score: 9    End of Session   Activity Tolerance: Patient limited by pain Patient left: in bed;with call bell/phone within reach;with bed alarm set;with SCD's reapplied Nurse Communication:  Mobility status PT Visit Diagnosis: Other abnormalities of gait and mobility (R26.89);Unsteadiness on feet (R26.81);Muscle weakness (generalized) (M62.81);Pain;Difficulty in walking, not elsewhere classified (R26.2) Pain - Right/Left: Left Pain - part of body: Knee    Time: 1330-1405 PT Time Calculation (min) (ACUTE ONLY): 35 min   Charges:   PT Evaluation $PT Eval Low Complexity: 1 Low PT Treatments $Therapeutic Exercise: 8-22 mins       Lieutenant Diego PT, DPT 2:26 PM,06/19/19 [phone]

## 2019-06-19 NOTE — TOC Progression Note (Signed)
Transition of Care [facility]) - Progression Note    Patient Details  Name: GWYNNE KEMNITZ MRN: [id number] Date of Birth: [birth year]  Transition [facility]) CM/SW [place], RN Phone Number: 06/19/2019, 11:29 AM  Clinical Narrative:     Requested the price of lovenox, will notify the patient once obtained       Expected Discharge Plan and Services                                                 Social Determinants of Health (SDOH) Interventions    Readmission Risk Interventions No flowsheet data found.

## 2019-06-19 NOTE — Anesthesia Preprocedure Evaluation (Signed)
Anesthesia Evaluation  Patient identified by MRN, date of birth, ID band Patient awake    Reviewed: Allergy & Precautions, H&P , NPO status , Patient's Chart, lab work & pertinent test results  Airway Mallampati: III  TM Distance: >3 FB Neck ROM: full    Dental  (+) Upper Dentures, Lower Dentures   Pulmonary neg pulmonary ROS, neg COPD,           Cardiovascular hypertension, (-) angina(-) Past MI (-) dysrhythmias      Neuro/Psych negative neurological ROS  negative psych ROS   GI/Hepatic Neg liver ROS, GERD  Controlled,  Endo/Other  negative endocrine ROS  Renal/GU      Musculoskeletal   Abdominal   Peds  Hematology negative hematology ROS (+)   Anesthesia Other Findings Past Medical History: No date: GERD (gastroesophageal reflux disease) No date: Hyperlipidemia No date: Hypertension No date: Osteoarthritis of right knee  Past Surgical History: No date: APPENDECTOMY No date: BILATERAL CARPAL TUNNEL RELEASE 11/23/2018: TOTAL KNEE ARTHROPLASTY; Right     Comment:  Procedure: TOTAL KNEE ARTHROPLASTY-RIGHT INTERPRETER               APPOINTMENT;  Surgeon: Corky Mull, MD;  Location: [facility]              ORS;  Service: Orthopedics;  Laterality: Right; No date: TUBAL LIGATION  BMI    Body Mass Index: 30.90 kg/m      Reproductive/Obstetrics negative OB ROS                             Anesthesia Physical Anesthesia Plan  ASA: II  Anesthesia Plan: Spinal   Post-op Pain Management:    Induction:   PONV Risk Score and Plan:   Airway Management Planned: Natural Airway and Simple Face Mask  Additional Equipment:   Intra-op Plan:   Post-operative Plan:   Informed Consent: I have reviewed the patients History and Physical, chart, labs and discussed the procedure including the risks, benefits and alternatives for the proposed anesthesia with the patient or authorized representative  who has indicated his/her understanding and acceptance.     Dental Advisory Given  Plan Discussed with: Anesthesiologist and CRNA  Anesthesia Plan Comments:         Anesthesia Quick Evaluation

## 2019-06-19 NOTE — Anesthesia Procedure Notes (Signed)
Date/Time: 06/19/2019 8:15 AM Performed by: Philbert Riser, CRNA Pre-anesthesia Checklist: Patient identified, Emergency Drugs available, Suction available, Patient being monitored and Timeout performed

## 2019-06-19 NOTE — Progress Notes (Signed)
Right leg and foot pink in color and warm to touch.  Good strong pedal pulse.

## 2019-06-19 NOTE — Op Note (Signed)
06/19/2019  10:10 AM  Patient:   Rose Roberts  Pre-Op Diagnosis:   Degenerative joint disease, left knee.  Post-Op Diagnosis:   Same  Procedure:   Left TKA using all-cemented Bi[employer] Vanguard system with a 65 mm PCR femur, a 71 mm tibial tray with a 12 mm anterior stabilized e-poly insert, and a 31 x 8 mm all-poly 3-pegged domed patella.  Surgeon:   Maryagnes AmosJ. Jeffrey , MD  Assistant:   Horris LatinoLance McGhee, PA-C   Anesthesia:   Spinal  Findings:   As above  Complications:   None  EBL:   20 cc  Fluids:   900 cc crystalloid  UOP:   125 cc  TT:   95 minutes at 300 mmHg  Drains:   None  Closure:   Staples  Implants:   As above  Brief Clinical Note:   The patient is a 68 year old female with a long history of progressively worsening left knee pain. The patient's symptoms have progressed despite medications, activity modification, injections, etc. The patient's history and examination were consistent with advanced degenerative joint disease of the right knee confirmed by plain radiographs. The patient presents at this time for a left total knee arthroplasty.  Procedure:   The patient was brought into the operating room. After adequate spinal anesthesia was obtained, the patient was lain in the supine position. A Foley catheter was placed by the nurse before the right lower extremity was prepped with ChloraPrep solution and draped sterilely. Preoperative antibiotics were administered. After verifying the proper laterality with a surgical timeout, the limb was exsanguinated with an Esmarch and the tourniquet inflated to 300 mmHg. A standard anterior approach to the knee was made through an approximately 7 inch incision. The incision was carried down through the subcutaneous tissues to expose superficial retinaculum. This was split the length of the incision and the medial flap elevated sufficiently to expose the medial retinaculum. The medial retinaculum was incised, leaving a 3-4 mm cuff of  tissue on the patella. This was extended distally along the medial border of the patellar tendon and proximally through the medial third of the quadriceps tendon. A subtotal fat pad excision was performed before the soft tissues were elevated off the anteromedial and anterolateral aspects of the proximal tibia to the level of the collateral ligaments. The anterior portions of the medial and lateral menisci were removed, as was the anterior cruciate ligament. With the knee flexed to 90, the external tibial guide was positioned and the appropriate proximal tibial cut made. This piece was taken to the back table where it was measured and found to be optimally replicated by a 71 mm component.  Attention was directed to the distal femur. The intramedullary canal was accessed through a 3/8" drill hole. The intramedullary guide was inserted and positioned in order to obtain a neutral flexion gap. The intercondylar block was positioned with care taken to avoid notching the anterior cortex of the femur. The appropriate cut was made. Next, the distal cutting block was placed at 6 of valgus alignment. Using the 9 mm slot, the distal cut was made. The distal femur was measured and found to be optimally replicated by the 65 mm component. The 65 mm 4-in-1 cutting block was positioned and first the posterior, then the posterior chamfer, the anterior chamfer, and finally the anterior cuts were made. At this point, the posterior portions medial and lateral menisci were removed. A trial reduction was performed using the appropriate femoral and tibial components with  first the 10 mm and then the 12 mm insert. The 12 mm insert demonstrated excellent stability to varus and valgus stressing both in flexion and extension while permitting full extension. Patella tracking was assessed and found to be excellent. Therefore, the tibial guide position was marked on the proximal tibia. The patella thickness was measured and found to be 20  mm. Therefore, the appropriate cut was made. The patellar surface was measured and found to be optimally replicated by the 31 mm component. The three peg holes were drilled in place before the trial button was inserted. Patella tracking was assessed and found to be excellent, passing the "no thumb test". The lug holes were drilled into the distal femur before the trial component was removed, leaving only the tibial tray. The keel was then created using the appropriate tower, reamer, and punch.  The bony surfaces were prepared for cementing by irrigating them thoroughly with bacitracin saline solution via the jet lavage system. A bone plug was fashioned from some of the bone that had been removed previously and used to plug the distal femoral canal. In addition, 20 cc of Exparel diluted out to 60 cc with normal saline and 30 cc of 0.5% Sensorcaine were injected into the postero-medial and postero-lateral aspects of the knee, the medial and lateral gutter regions, and the peri-incisional tissues to help with postoperative analgesia. Meanwhile, the cement was being mixed on the back table. When it was ready, the tibial tray was cemented in first. The excess cement was removed using [employer] fast streamer. Next, the femoral component was impacted into place. Again, the excess cement was removed using [employer] fast streamer. The 12 mm trial insert was positioned and the knee brought into extension while the cement hardened. Finally, the patella was cemented into place and secured using the patellar clamp. Again, the excess cement was removed using [employer] fast streamer. Once the cement had hardened, the knee was placed through a range of motion with the findings as described above. Therefore, the trial insert was removed and, after verifying that no cement had been retained posteriorly, the permanent 12 mm anterior stabilized E-polyethylene insert was positioned and secured using the appropriate key locking mechanism. Again the knee was  placed through a range of motion with the findings as described above.  The wound was copiously irrigated with bacitracin saline solution using the jet lavage system before the quadriceps tendon and retinacular layer were reapproximated using #0 Vicryl interrupted sutures. The superficial retinacular layer also was closed using a running #0 Vicryl suture. A total of 10 cc of transexemic acid (TXA) was injected intra-articularly before the subcutaneous tissues were closed in several layers using 2-0 Vicryl interrupted sutures. The skin was closed using staples. A sterile honeycomb dressing was applied to the skin before the leg was wrapped with an Ace wrap to accommodate the Polar Care device. The patient was then awakened and returned to the recovery room in satisfactory condition after tolerating the procedure well.

## 2019-06-19 NOTE — Progress Notes (Signed)
15 minute call to floor. 

## 2019-06-19 NOTE — Anesthesia Post-op Follow-up Note (Signed)
Anesthesia QCDR form completed.        

## 2019-06-20 ENCOUNTER — Inpatient Hospital Stay: Payer: Medicare Other

## 2019-06-20 LAB — CBC WITH DIFFERENTIAL/PLATELET
Abs Immature Granulocytes: 0.04 10*3/uL (ref 0.00–0.07)
Abs Immature Granulocytes: 0.05 10*3/uL (ref 0.00–0.07)
Basophils Absolute: 0 10*3/uL (ref 0.0–0.1)
Basophils Absolute: 0 10*3/uL (ref 0.0–0.1)
Basophils Relative: 0 %
Basophils Relative: 0 %
Eosinophils Absolute: 0.1 10*3/uL (ref 0.0–0.5)
Eosinophils Absolute: 0.1 10*3/uL (ref 0.0–0.5)
Eosinophils Relative: 1 %
Eosinophils Relative: 1 %
HCT: 36.3 % (ref 36.0–46.0)
HCT: 36.8 % (ref 36.0–46.0)
Hemoglobin: 11.7 g/dL — ABNORMAL LOW (ref 12.0–15.0)
Hemoglobin: 11.7 g/dL — ABNORMAL LOW (ref 12.0–15.0)
Immature Granulocytes: 0 %
Immature Granulocytes: 0 %
Lymphocytes Relative: 33 %
Lymphocytes Relative: 35 %
Lymphs Abs: 4 10*3/uL (ref 0.7–4.0)
Lymphs Abs: 4.2 10*3/uL — ABNORMAL HIGH (ref 0.7–4.0)
MCH: 26.7 pg (ref 26.0–34.0)
MCH: 26.7 pg (ref 26.0–34.0)
MCHC: 31.8 g/dL (ref 30.0–36.0)
MCHC: 32.2 g/dL (ref 30.0–36.0)
MCV: 82.9 fL (ref 80.0–100.0)
MCV: 83.8 fL (ref 80.0–100.0)
Monocytes Absolute: 0.6 10*3/uL (ref 0.1–1.0)
Monocytes Absolute: 0.8 10*3/uL (ref 0.1–1.0)
Monocytes Relative: 5 %
Monocytes Relative: 6 %
Neutro Abs: 6.7 10*3/uL (ref 1.7–7.7)
Neutro Abs: 7.8 10*3/uL — ABNORMAL HIGH (ref 1.7–7.7)
Neutrophils Relative %: 58 %
Neutrophils Relative %: 61 %
Platelets: 225 10*3/uL (ref 150–400)
Platelets: 229 10*3/uL (ref 150–400)
RBC: 4.38 MIL/uL (ref 3.87–5.11)
RBC: 4.39 MIL/uL (ref 3.87–5.11)
RDW: 15 % (ref 11.5–15.5)
RDW: 15 % (ref 11.5–15.5)
WBC: 11.7 10*3/uL — ABNORMAL HIGH (ref 4.0–10.5)
WBC: 12.8 10*3/uL — ABNORMAL HIGH (ref 4.0–10.5)
nRBC: 0 % (ref 0.0–0.2)
nRBC: 0 % (ref 0.0–0.2)

## 2019-06-20 LAB — BASIC METABOLIC PANEL
Anion gap: 10 (ref 5–15)
BUN: 10 mg/dL (ref 8–23)
CO2: 24 mmol/L (ref 22–32)
Calcium: 8.3 mg/dL — ABNORMAL LOW (ref 8.9–10.3)
Chloride: 105 mmol/L (ref 98–111)
Creatinine, Ser: 0.75 mg/dL (ref 0.44–1.00)
GFR calc Af Amer: >60 mL/min (ref >60)
GFR calc non Af Amer: >60 mL/min (ref >60)
Glucose, Bld: 132 mg/dL — ABNORMAL HIGH (ref 70–99)
Potassium: 4 mmol/L (ref 3.5–5.1)
Sodium: 139 mmol/L (ref 135–145)

## 2019-06-20 LAB — GLUCOSE, CAPILLARY: Glucose-Capillary: 71 mg/dL (ref 70–99)

## 2019-06-20 NOTE — Progress Notes (Addendum)
Pt. Call ed to Nurses station. Went to the room the stop cock on the IV from the OR came off the IV. Patient IV had a back flow of blood from  the IV in the bed and all over the floor. Was a very large pool of blood on the floor and in the patients bed which woke her up since she was wet. VSS taken. Patient cleaned . Patient proceed to have chills, report being cold and SOB. Concerned for the amount of blood loss paged on call MD, called [facility]. Communicated with patient with online interpreter to help her understand what was going on and what were were doing for here.

## 2019-06-20 NOTE — TOC Progression Note (Signed)
Transition of Care [facility]) - Progression Note    Patient Details  Name: Rose Roberts MRN: [id number] Date of Birth: Oct 21, 1950  Transition of Care [facility]) CM/SW [place], RN Phone Number: 06/20/2019, 12:16 PM  Clinical Narrative:    Met with the patient with medical interpreter She refused to go to SNF and stated she is going home with her grand daughter and daughter, she has a RW and a BSC at home and has used [facility] previously and would like to use them again, I notified Corene Cornea with [facility] and he accepted the patient, the Lovenox price of $33.77 was given to the patient         Expected Discharge Plan and Services           Expected Discharge Date: 06/21/19                                     Social Determinants of Health (SDOH) Interventions    Readmission Risk Interventions No flowsheet data found.

## 2019-06-20 NOTE — Progress Notes (Signed)
PT notified RN after therapy session that pt was reporting left calf pain while interpreter was present. Upon assessment, pt does confirm calf pain and pt does have swelling in left leg. Dr. Roland Rack notified; received order for doppler to r/o DVT.

## 2019-06-20 NOTE — Progress Notes (Signed)
Physical Therapy Treatment Patient Details Name: Rose CongressBerta A Kloc MRN: [id number] DOB: 12/06/1950 Today's Date: 06/20/2019    History of Present Illness Patient is 68 yo female s/p L TKA, WBAT. PMH of HTN, HLD.    PT Comments    Interpreter Annice PihJackie use throughout session. Pt agreeable to PT to attempts stair training. Pt had just returned to recliner from ambulating with nurse tech to bathroom. Pt in obvious pain; reports 8/10. Portable step brought into room for stair training. Pt demonstrates knowledge of technique from having other knee replaced prior. STS with Min guard for safety. Pt with increased difficulty placing weight throughout LLE this session. Attempted ascending RLE twice; however, pt unable to with use of BUE on support and Mod A due to pain in LLE. Pt returned to chair. LLE is without TED hose and calf is quite swollen; no appreciated redness. Mild warmth. Homans sign performed and positive with sharp pain and pt pointing to back of knee as pain location. Nursing called with concerns of DVT. No further PT attempted at this time. Continue PT as appropriate based on findings from doppler. Pt will need stair training prior to returning home. Did discuss option of returning home via EMS, as pt is refusing rehab. Await further testing to rule out DVT.     Follow Up Recommendations  Home health PT     Equipment Recommendations  Rolling walker with 5" wheels;Other (comment)(received)    Recommendations for Other Services       Precautions / Restrictions Precautions Precautions: Fall;Knee Restrictions Weight Bearing Restrictions: Yes LLE Weight Bearing: Weight bearing as tolerated    Mobility  Bed Mobility               General bed mobility comments: Not tested; up in chair  Transfers Overall transfer level: Needs assistance Equipment used: Rolling walker (2 wheeled) Transfers: Sit to/from Stand Sit to Stand: Min guard         General transfer comment: Good use  of hands; encouraged gentle weight shift with L QS with initial stand to allow for improved weight bearing and balance/stability. Unable to place much weight on LLE this session  Ambulation/Gait Ambulation/Gait assistance: Min guard Gait Distance (Feet): 2 Feet Assistive device: Rolling walker (2 wheeled) Gait Pattern/deviations: Step-to pattern   Gait velocity interpretation: <1.31 ft/sec, indicative of household ambulator General Gait Details: Stepping only up to portable step to practice    Stairs Stairs: Yes       General stair comments: Attempted steps twice. Pt unable to bear weight enough on LLE to ascend RLE 1 step despite UE support and Mod A from therapist.   Wheelchair Mobility    Modified Rankin (Stroke Patients Only)       Balance Overall balance assessment: Needs assistance Sitting-balance support: Feet supported;No upper extremity supported Sitting balance-Leahy Scale: Good     Standing balance support: Bilateral upper extremity supported Standing balance-Leahy Scale: Fair                              Cognition Arousal/Alertness: Awake/alert Behavior During Therapy: WFL for tasks assessed/performed Overall Cognitive Status: Within Functional Limits for tasks assessed                                        Exercises Total Joint Exercises Ankle Circles/Pumps: AROM;Both;20 reps Quad Sets:  Strengthening;Both;20 reps(also in stand with wt shift 10x) Knee Flexion: AROM;Left;10 reps;Seated;Other (comment)(3 positions with hold each position) Goniometric ROM: 3-76    General Comments        Pertinent Vitals/Pain Pain Assessment: 0-10 Pain Score: 8  Pain Location: L knee with movement, stretch and with ankle roll under calf Pain Descriptors / Indicators: Aching;Discomfort;Grimacing;Constant;Moaning;Sharp Pain Intervention(s): Premedicated before session;Ice applied;Other (comment)(called nsg regarding concerns for possible  DVT)    Home Living                      Prior Function            PT Goals (current goals can now be found in the care plan section) Progress towards PT goals: PT to reassess next treatment    Frequency    BID      PT Plan Discharge plan needs to be updated    Co-evaluation              AM-PAC PT "6 Clicks" Mobility   Outcome Measure  Help needed turning from your back to your side while in a flat bed without using bedrails?: A Little Help needed moving from lying on your back to sitting on the side of a flat bed without using bedrails?: A Little Help needed moving to and from a bed to a chair (including a wheelchair)?: A Little Help needed standing up from a chair using your arms (e.g., wheelchair or bedside chair)?: A Little Help needed to walk in hospital room?: A Little Help needed climbing 3-5 steps with a railing? : A Little 6 Click Score: 18    End of Session Equipment Utilized During Treatment: Gait belt Activity Tolerance: Patient limited by pain Patient left: in chair;with call bell/phone within reach;Other (comment)(polar care in place; nsg in to assess pt)   PT Visit Diagnosis: Other abnormalities of gait and mobility (R26.89);Unsteadiness on feet (R26.81);Muscle weakness (generalized) (M62.81);Pain;Difficulty in walking, not elsewhere classified (R26.2) Pain - Right/Left: Left Pain - part of body: Knee     Time: 8588-5027 PT Time Calculation (min) (ACUTE ONLY): 18 min  Charges:  $Gait Training: 8-22 mins $Therapeutic Exercise: 8-22 mins $Therapeutic Activity: 8-22 mins                      Larae Grooms, PTA 06/20/2019, 3:35 PM

## 2019-06-20 NOTE — Progress Notes (Signed)
RRT called at 2359 due to increase bleeding from IV site from a disconnected stop cock. pt noted to be in bed with chills , pt spanish speaking only interpreter obtained for translation. Pt nurse /RT/ A/c  At bedside . Pt vital signs WNL pt sats 100% on room air. Pt expressed that she was cold and SOB. CBG 71 pt was able to drink some juice.  Warm blankets applied. Pt has decrease chills noted.Dr. Rudene Christians made aware and CBC ordered . Will follow up with MD if hgb <10. Will cont. To monitor .

## 2019-06-20 NOTE — Evaluation (Signed)
Occupational Therapy Evaluation Patient Details Name: Rose Roberts MRN: [id number] DOB: Apr 24, 1951 Today's Date: 06/20/2019    History of Present Illness Patient is 68 yo female s/p L TKA, WBAT. PMH of HTN, HLD.   Clinical Impression   Rose Roberts was seen for OT evaluation this date, POD#1 from above surgery. This Chief Strategy Officer utilized spanish language interpreter services on this date including [employer] language line at start of session (assisted by [place] [employer] [phone]) and transitioned to in-person interpreter services through [facility] hospital (assited by Rose Roberts) for the remainder of the session. Pt reports she was generally independent in ADLs prior to surgery, however occasionally using SPC for functional mobility due to L knee pain. Pt is eager to return to PLOF with less pain and improved safety and independence. Pt currently requires minimal-moderate assist for LB dressing while in seated position due to pain and limited AROM of L knee. Pt instructed in polar care mgt, falls prevention strategies, home/routines modifications, DME/AE for LB bathing and dressing tasks, and compression stocking mgt. Pt would benefit from skilled OT services including additional instruction in dressing techniques with or without assistive devices for dressing and bathing skills to support recall and carryover prior to discharge and ultimately to maximize safety, independence, and minimize falls risk and caregiver burden. Recommend HHOT upon hospital DC to maximize pt safety and return to functional independence during meaningful occupations of daily life.      Follow Up Recommendations  Home health OT    Equipment Recommendations  Other (comment)(TBD)    Recommendations for Other Services       Precautions / Restrictions Precautions Precautions: Fall;Knee Precaution Booklet Issued: No Restrictions Weight Bearing Restrictions: Yes LLE Weight Bearing: Weight bearing as tolerated       Mobility Bed Mobility Overal bed mobility: Needs Assistance Bed Mobility: Supine to Sit     Supine to sit: Min assist;Min guard     General bed mobility comments: minA for LLE management, cues to improve pt positioning  in bed  Transfers Overall transfer level: Needs assistance Equipment used: Rolling walker (2 wheeled) Transfers: Sit to/from Stand Sit to Stand: Min guard;Supervision         General transfer comment: Pt able to complete STS transfers from bed, recliner, and room commode on this date given CGA for safety and VC's for technique. Pt ambulated in room with SBA for mgt of lines and leads. Able to navigate room safely.    Balance Overall balance assessment: Needs assistance Sitting-balance support: Feet supported;No upper extremity supported Sitting balance-Leahy Scale: Good Sitting balance - Comments: Steady sitting, reaching within BOS.   Standing balance support: During functional activity;Bilateral upper extremity supported;No upper extremity supported Standing balance-Leahy Scale: Good Standing balance comment: Pt able to stand at sink briefly to wash hands after using the commode. Steady reaching within BOS.                           ADL either performed or assessed with clinical judgement   ADL                                         General ADL Comments: Pt requires min-moderate assistance for LB adl mgt in seated position due to pain and decreased ROM in her L knee. Set-up assit for UB adl mgt. Supervision to [facility] for safety  during functional mobility using a RW. Pt independent with peri-care after using room bathroom on this date.     Vision Baseline Vision/History: Wears glasses Wears Glasses: At all times Patient Visual Report: No change from baseline       Perception     Praxis      Pertinent Vitals/Pain Pain Assessment: 0-10 Pain Score: 8  Pain Location: L knee with amb. At start of session pt stated she was  in no pain. Increased with mobility attempts. Pain Descriptors / Indicators: Guarding;Grimacing;Crying Pain Intervention(s): Limited activity within patient's tolerance;Relaxation;Monitored during session;Repositioned;Ice applied     Hand Dominance Right   Extremity/Trunk Assessment Upper Extremity Assessment Upper Extremity Assessment: Overall WFL for tasks assessed   Lower Extremity Assessment Lower Extremity Assessment: RLE deficits/detail;LLE deficits/detail;Defer to PT evaluation RLE Deficits / Details: WFLs LLE Deficits / Details: s/p TKA LLE Coordination: decreased gross motor;decreased fine motor   Cervical / Trunk Assessment Cervical / Trunk Assessment: Normal   Communication Communication Communication: Interpreter utilized   Cognition Arousal/Alertness: Awake/alert Behavior During Therapy: WFL for tasks assessed/performed Overall Cognitive Status: Within Functional Limits for tasks assessed                                     General Comments       Exercises Other Exercises Other Exercises: Pt educated in falls prevention strategies, safe use of AE for mobility and ADL mgt, compression stocking mgt, and polar care mgt on this date. Would benefit from reinforcement in education and opportunity to trial AE for LB ADL.   Shoulder Instructions      Home Living Family/patient expects to be discharged to:: Private residence Living Arrangements: Other relatives(grdtrs) Available Help at Discharge: Family;Available 24 hours/day Type of Home: House Home Access: Stairs to enter [employer] of Steps: 2 Entrance Stairs-Rails: Right;Left;Can reach both Home Layout: One level     Bathroom Shower/Tub: Chief Strategy Officer: Handicapped height Bathroom Accessibility: Yes   Home Equipment: Environmental consultant - 2 wheels;Cane - single point          Prior Functioning/Environment Level of Independence: Independent with assistive device(s)         Comments: PT uses SPC to ambulate at baseline        OT Problem List: Decreased strength;Decreased coordination;Pain;Decreased range of motion;Decreased activity tolerance;Decreased safety awareness;Decreased knowledge of use of DME or AE;Decreased knowledge of precautions      OT Treatment/Interventions: Self-care/ADL training;Therapeutic exercise;Balance training;Therapeutic activities;DME and/or AE instruction;Patient/family education    OT Goals(Current goals can be found in the care plan section) Acute Rehab OT Goals Patient Stated Goal: to go home OT Goal Formulation: With patient Time For Goal Achievement: 07/04/19 Potential to Achieve Goals: Good ADL Goals Pt Will Perform Lower Body Bathing: with set-up;with supervision;sitting/lateral leans(With LRAD PRn for improved safety and functional independence.) Pt Will Perform Lower Body Dressing: with adaptive equipment;with supervision;with set-up;sit to/from stand(With LRAD PRN for improved safety and functional independence.) Pt Will Transfer to Toilet: ambulating;regular height toilet;with supervision;with set-up(With LRAD PRN for improved safety and functional independence.)  OT Frequency: Min 1X/week   Barriers to D/C:            Co-evaluation              AM-PAC OT "6 Clicks" Daily Activity     Outcome Measure Help from another person eating meals?: None Help from another person taking  care of personal grooming?: A Little Help from another person toileting, which includes using toliet, bedpan, or urinal?: A Little Help from another person bathing (including washing, rinsing, drying)?: A Lot Help from another person to put on and taking off regular upper body clothing?: A Little Help from another person to put on and taking off regular lower body clothing?: A Lot 6 Click Score: 17   End of Session Equipment Utilized During Treatment: Gait belt;Rolling walker Nurse Communication: Other (comment)(Nursing  supervisor informed pt wishing to speak with someone about medical event last PM.)  Activity Tolerance: Patient tolerated treatment well Patient left: in chair;with call bell/phone within reach;with chair alarm set;with SCD's reapplied;Other (comment)(With polar care in place.)  OT Visit Diagnosis: Other abnormalities of gait and mobility (R26.89);Pain Pain - Right/Left: Left Pain - part of body: Knee                Time: [id number] OT Time Calculation (min): 49 min Charges:  OT General Charges $OT Visit: 1 Visit OT Evaluation $OT Eval Low Complexity: 1 Low OT Treatments $Self Care/Home Management : 38-52 mins  Rockney GheeSerenity Nyzier Boivin, M.S., OTR/L Ascom: [phone] 06/20/19, 10:43 AM

## 2019-06-20 NOTE — Progress Notes (Signed)
Subjective: 1 Day Post-Op Procedure(s) (LRB): TOTAL KNEE ARTHROPLASTY (Left) Patient reports pain as 6 on 0-10 scale.   Patient is well, and has had no acute complaints or problems PT and care management to assist with discharge planning. Negative for chest pain and shortness of breath Fever: no Gastrointestinal:Negative for nausea and vomiting  Objective: Vital signs in last 24 hours: Temp:  [97.4 F (36.3 C)-98.9 F (37.2 C)] 98.7 F (37.1 C) (09/02 0426) Pulse Rate:  [61-91] 91 (09/02 0426) Resp:  [14-21] 17 (09/02 0426) BP: (119-160)/(62-76) 158/74 (09/02 0426) SpO2:  [94 %-100 %] 94 % (09/02 0426)  Intake/Output from previous day:  Intake/Output Summary (Last 24 hours) at 06/20/2019 0755 Last data filed at 06/20/2019 0405 Gross per 24 hour  Intake 1423.6 ml  Output 1350 ml  Net 73.6 ml    Intake/Output this shift: No intake/output data recorded.  Labs: Recent Labs    06/20/19 0023 06/20/19 0623  HGB 11.7* 11.7*   Recent Labs    06/20/19 0023 06/20/19 0623  WBC 11.7* 12.8*  RBC 4.38 4.39  HCT 36.3 36.8  PLT 229 225   Recent Labs    06/20/19 0623  NA 139  K 4.0  CL 105  CO2 24  BUN 10  CREATININE 0.75  GLUCOSE 132*  CALCIUM 8.3*   No results for input(s): LABPT, INR in the last 72 hours.   EXAM General - Patient is Alert, Appropriate and Oriented.  Spanish interpretation aided by tablet. Extremity - ABD soft Neurovascular intact Sensation intact distally Intact pulses distally Dorsiflexion/Plantar flexion intact Incision: dressing C/D/I Dressing/Incision - clean, dry, no drainage Motor Function - intact, moving foot and toes well on exam.   Past Medical History:  Diagnosis Date  . GERD (gastroesophageal reflux disease)   . Hyperlipidemia   . Hypertension   . Osteoarthritis of right knee     Assessment/Plan: 1 Day Post-Op Procedure(s) (LRB): TOTAL KNEE ARTHROPLASTY (Left) Active Problems:   Status post total knee replacement using  cement, left  Estimated body mass index is 30.9 kg/m as calculated from the following:   Height as of this encounter: 5\' 4"  (1.626 m).   Weight as of this encounter: 81.6 kg. Advance diet Up with therapy D/C IV fluids when tolerating po intake.  Labs reviewed this AM. Patient had difficulty with IV yesterday with bleeding last night.  Hg 11.7 this AM. WBC 12.8 this morning, continue to monitor. CBC and BMP ordered for tomorrow morning. Continue with PT today. Patient is passing gas without pain this AM. Plan for possible discharge tomorrow pending progress today.  DVT Prophylaxis - Lovenox, Foot Pumps and TED hose Weight-Bearing as tolerated to left leg  J. Cameron Proud, PA-C [facility] 06/20/2019, 7:55 AM

## 2019-06-20 NOTE — Anesthesia Postprocedure Evaluation (Signed)
Anesthesia Post Note  Patient: Rose Roberts  Procedure(s) Performed: TOTAL KNEE ARTHROPLASTY (Left Knee)  Patient location during evaluation: Nursing Unit Anesthesia Type: Spinal Level of consciousness: oriented and awake and alert Pain management: pain level controlled Vital Signs Assessment: post-procedure vital signs reviewed and stable Respiratory status: spontaneous breathing and respiratory function stable Cardiovascular status: blood pressure returned to baseline and stable Postop Assessment: no headache, no backache, no apparent nausea or vomiting and patient able to bend at knees Anesthetic complications: no     Last Vitals:  Vitals:   06/20/19 0426 06/20/19 0700  BP: (!) 158/74 (!) 158/73  Pulse: 91 79  Resp: 17 18  Temp: 37.1 C 37 C  SpO2: 94% 98%    Last Pain:  Vitals:   06/20/19 0128  TempSrc: Oral  PainSc: [place]

## 2019-06-20 NOTE — Progress Notes (Signed)
Physical Therapy Treatment Patient Details Name: Rose Roberts MRN: [id number] DOB: 07/09/1951 Today's Date: 06/20/2019    History of Present Illness Patient is 68 yo female s/p L TKA, WBAT. PMH of HTN, HLD.    PT Comments    Interpreter Marissa used throughout session. Pt agreeable to PT; reports 8/10 L knee pain with movement and proper long sit positioning. Initially pt found sitting in recliner with pillow under L knee. Pt educated and corrected on proper positioning with towel roll. Ultimately pt tolerates roll under calf with discomfort. Pt demonstrates improving L knee range 3-76 degrees. STS and ambulation for 40 ft with Min guard. Pt demonstrates step to gait pattern with inconsistent sequence and tendency to walk too close into rolling walker; educated on improved placement and stepping sequence, but continues inconsistent. Pt has 3 STE home; plan to practice this afternoon post medication at 14:15, to allow for optimal discharge home.    Follow Up Recommendations  Home health PT     Equipment Recommendations  Rolling walker with 5" wheels;Other (comment)(received)    Recommendations for Other Services       Precautions / Restrictions Precautions Precautions: Fall;Knee Precaution Booklet Issued: No Restrictions Weight Bearing Restrictions: Yes LLE Weight Bearing: Weight bearing as tolerated    Mobility  Bed Mobility Overal bed mobility: Needs Assistance Bed Mobility: Supine to Sit     Supine to sit: Min assist;Min guard     General bed mobility comments: Not tested; up in chair  Transfers Overall transfer level: Needs assistance Equipment used: Rolling walker (2 wheeled) Transfers: Sit to/from Stand Sit to Stand: Min guard;Supervision         General transfer comment: Good use of hands; encouraged gentle weight shift with L QS with initial stand to allow for improved weight bearing and balance/stability  Ambulation/Gait Ambulation/Gait assistance: Min  guard Gait Distance (Feet): 40 Feet Assistive device: Rolling walker (2 wheeled) Gait Pattern/deviations: Step-to pattern   Gait velocity interpretation: <1.31 ft/sec, indicative of household ambulator General Gait Details: slow, small steps with tendency to walk too far into rw; encouraged improved rw placement further to allow for steps. Overall inconsistent. No overt LOB   Stairs             Wheelchair Mobility    Modified Rankin (Stroke Patients Only)       Balance Overall balance assessment: Needs assistance Sitting-balance support: Feet supported;No upper extremity supported Sitting balance-Leahy Scale: Good Sitting balance - Comments: Steady sitting, reaching within BOS.   Standing balance support: Bilateral upper extremity supported Standing balance-Leahy Scale: Fair Standing balance comment: Pt able to stand at sink briefly to wash hands after using the commode. Steady reaching within BOS.                            Cognition Arousal/Alertness: Awake/alert Behavior During Therapy: WFL for tasks assessed/performed Overall Cognitive Status: Within Functional Limits for tasks assessed                                        Exercises Total Joint Exercises Ankle Circles/Pumps: AROM;Both;20 reps Quad Sets: Strengthening;Both;20 reps(also in stand with wt shift 10x) Knee Flexion: AROM;Left;10 reps;Seated;Other (comment)(3 positions with hold each position) Goniometric ROM: 3-76 Other Exercises Other Exercises: Pt educated in falls prevention strategies, safe use of AE for mobility and ADL mgt, compression  stocking mgt, and polar care mgt on this date. Would benefit from reinforcement in education and opportunity to trial AE for LB ADL.    General Comments        Pertinent Vitals/Pain Pain Assessment: 0-10 Pain Score: 8  Pain Location: L knee with movement, stretch and with ankle roll under calf Pain Descriptors / Indicators:  Aching;Discomfort;Grimacing Pain Intervention(s): Limited activity within patient's tolerance;Relaxation;Monitored during session;Repositioned;Ice applied    Home Living Family/patient expects to be discharged to:: Private residence Living Arrangements: Other relatives(grdtrs) Available Help at Discharge: Family;Available 24 hours/day Type of Home: House Home Access: Stairs to enter Entrance Stairs-Rails: Right;Left;Can reach both Home Layout: One level Home Equipment: Environmental consultant - 2 wheels;Cane - single point      Prior Function Level of Independence: Independent with assistive device(s)      Comments: PT uses SPC to ambulate at baseline   PT Goals (current goals can now be found in the care plan section) Acute Rehab PT Goals Patient Stated Goal: to go home Progress towards PT goals: Progressing toward goals    Frequency    BID      PT Plan Discharge plan needs to be updated    Co-evaluation              AM-PAC PT "6 Clicks" Mobility   Outcome Measure  Help needed turning from your back to your side while in a flat bed without using bedrails?: A Little Help needed moving from lying on your back to sitting on the side of a flat bed without using bedrails?: A Little Help needed moving to and from a bed to a chair (including a wheelchair)?: A Little Help needed standing up from a chair using your arms (e.g., wheelchair or bedside chair)?: A Little Help needed to walk in hospital room?: A Little Help needed climbing 3-5 steps with a railing? : A Little 6 Click Score: 18    End of Session Equipment Utilized During Treatment: Gait belt Activity Tolerance: Patient limited by pain Patient left: in chair;with call bell/phone within reach;with chair alarm set;with SCD's reapplied;Other (comment)(polar care in place)   PT Visit Diagnosis: Other abnormalities of gait and mobility (R26.89);Unsteadiness on feet (R26.81);Muscle weakness (generalized) (M62.81);Pain;Difficulty in  walking, not elsewhere classified (R26.2) Pain - Right/Left: Left Pain - part of body: Knee     Time: 1152-1220 PT Time Calculation (min) (ACUTE ONLY): 28 min  Charges:  $Gait Training: 8-22 mins $Therapeutic Exercise: 8-22 mins                     Larae Grooms, PTA 06/20/2019, 12:45 PM

## 2019-06-21 ENCOUNTER — Inpatient Hospital Stay: Payer: Medicare Other

## 2019-06-21 LAB — CBC
HCT: 35.7 % — ABNORMAL LOW (ref 36.0–46.0)
Hemoglobin: 11.6 g/dL — ABNORMAL LOW (ref 12.0–15.0)
MCH: 26.8 pg (ref 26.0–34.0)
MCHC: 32.5 g/dL (ref 30.0–36.0)
MCV: 82.4 fL (ref 80.0–100.0)
Platelets: 196 10*3/uL (ref 150–400)
RBC: 4.33 MIL/uL (ref 3.87–5.11)
RDW: 14.6 % (ref 11.5–15.5)
WBC: 14.8 10*3/uL — ABNORMAL HIGH (ref 4.0–10.5)
nRBC: 0 % (ref 0.0–0.2)

## 2019-06-21 LAB — BASIC METABOLIC PANEL
Anion gap: 9 (ref 5–15)
BUN: 9 mg/dL (ref 8–23)
CO2: 26 mmol/L (ref 22–32)
Calcium: 8.6 mg/dL — ABNORMAL LOW (ref 8.9–10.3)
Chloride: 101 mmol/L (ref 98–111)
Creatinine, Ser: 0.67 mg/dL (ref 0.44–1.00)
GFR calc Af Amer: >60 mL/min (ref >60)
GFR calc non Af Amer: >60 mL/min (ref >60)
Glucose, Bld: 157 mg/dL — ABNORMAL HIGH (ref 70–99)
Potassium: 3.3 mmol/L — ABNORMAL LOW (ref 3.5–5.1)
Sodium: 136 mmol/L (ref 135–145)

## 2019-06-21 MED ORDER — ENOXAPARIN SODIUM 40 MG/0.4ML ~~LOC~~ SOLN: 40.0000 mg | SUBCUTANEOUS | 0 refills | Status: DC

## 2019-06-21 MED ORDER — OXYCODONE HCL 5 MG PO TABS: 5.0000 mg | ORAL_TABLET | ORAL | 0 refills | Status: DC | PRN

## 2019-06-21 MED ORDER — TRAMADOL HCL 50 MG PO TABS: 50.0000 mg | ORAL_TABLET | Freq: Four times a day (QID) | ORAL | 0 refills | Status: DC | PRN

## 2019-06-21 NOTE — Progress Notes (Signed)
Physical Therapy Treatment Patient Details Name: Rose Roberts MRN: [id number] DOB: Aug 22, 1951 Today's Date: 06/21/2019    History of Present Illness Patient is 68 yo female s/p L TKA, WBAT. PMH of HTN, HLD.    PT Comments    Interpreter not used this session as we had worked together earlier today and we were able to communicate for session based on previous interaction and expectations discussed for pm session in prior session.  In and out of bed with min a for LE management.  Stood and was able to complete lap around nursing unit with RW and min guard.  Improved use of walker this pm with no cues needed for walker placement.  ROM in sitting R knee.  Remains guarded due to pain with little change in ROM.  Returned to supine with polar care in place.   Follow Up Recommendations  Home health PT;Supervision for mobility/OOB     Equipment Recommendations  Rolling walker with 5" wheels;Other (comment)    Recommendations for Other Services       Precautions / Restrictions Precautions Precautions: Fall;Knee Restrictions Weight Bearing Restrictions: Yes LLE Weight Bearing: Weight bearing as tolerated    Mobility  Bed Mobility Overal bed mobility: Needs Assistance Bed Mobility: Supine to Sit;Sit to Supine     Supine to sit: Min assist Sit to supine: Min assist   General bed mobility comments: for LE management  Transfers Overall transfer level: Needs assistance Equipment used: Rolling walker (2 wheeled) Transfers: Sit to/from Stand Sit to Stand: Min guard         General transfer comment: good use of hands, min gaurd for general safety  Ambulation/Gait Ambulation/Gait assistance: Min guard;Min assist Gait Distance (Feet): 160 Feet Assistive device: Rolling walker (2 wheeled) Gait Pattern/deviations: Step-through pattern Gait velocity: decreased   General Gait Details: improved walker placement and use this session without cues   Stairs Stairs: Yes Stairs  assistance: Min guard;Min assist Stair Management: Two rails;Step to pattern Number of Stairs: 4 General stair comments: pt hesitant but was able to go up.down with time and encouragement   Wheelchair Mobility    Modified Rankin (Stroke Patients Only)       Balance Overall balance assessment: Needs assistance Sitting-balance support: Feet supported;No upper extremity supported Sitting balance-Leahy Scale: Good     Standing balance support: Bilateral upper extremity supported Standing balance-Leahy Scale: Fair Standing balance comment: initially needs min assist but quickly progresses to min guard during session.                            Cognition Arousal/Alertness: Awake/alert Behavior During Therapy: WFL for tasks assessed/performed Overall Cognitive Status: Within Functional Limits for tasks assessed                                        Exercises Total Joint Exercises Goniometric ROM: 2-76 - pt self limits due to pain Other Exercises Other Exercises: to commode to void.    General Comments        Pertinent Vitals/Pain Pain Assessment: Faces Faces Pain Scale: Hurts little more Pain Location: increases with mobility/ROM stretching Pain Descriptors / Indicators: Aching;Guarding;Grimacing;Moaning Pain Intervention(s): Limited activity within patient's tolerance;Monitored during session;Ice applied    Home Living  Prior Function            PT Goals (current goals can now be found in the care plan section) Progress towards PT goals: Progressing toward goals    Frequency    BID      PT Plan Current plan remains appropriate    Co-evaluation              AM-PAC PT "6 Clicks" Mobility   Outcome Measure  Help needed turning from your back to your side while in a flat bed without using bedrails?: A Little Help needed moving from lying on your back to sitting on the side of a flat bed  without using bedrails?: A Little Help needed moving to and from a bed to a chair (including a wheelchair)?: A Little Help needed standing up from a chair using your arms (e.g., wheelchair or bedside chair)?: A Little Help needed to walk in hospital room?: A Little Help needed climbing 3-5 steps with a railing? : A Little 6 Click Score: 18    End of Session Equipment Utilized During Treatment: Gait belt Activity Tolerance: Patient tolerated treatment well Patient left: in bed;with call bell/phone within reach;with bed alarm set   Pain - Right/Left: Left Pain - part of body: Knee     Time: [id number] PT Time Calculation (min) (ACUTE ONLY): 23 min  Charges:  $Gait Training: 8-22 mins $Therapeutic Exercise: 8-22 mins                    Danielle DessSarah Devarius Nelles, PTA 06/21/19, 4:11 PM

## 2019-06-21 NOTE — Progress Notes (Signed)
Subjective: 2 Days Post-Op Procedure(s) (LRB): TOTAL KNEE ARTHROPLASTY (Left) Patient reports pain as 6 on 0-10 scale. Did have increased left leg pain yesterday, [place] negative for DVT.   Patient is well, and has had no acute complaints or problems Current plan is for discharge home with HHPT. Negative for chest pain and shortness of breath Fever: Yes, most recent temp of 100.7. Gastrointestinal:Negative for nausea and vomiting  Objective: Vital signs in last 24 hours: Temp:  [98.6 F (37 C)-100.7 F (38.2 C)] 100.7 F (38.2 C) (09/03 1553) Pulse Rate:  [93-106] 98 (09/03 1553) Resp:  [16-18] 16 (09/03 0433) BP: (157-192)/(68-85) 157/77 (09/03 1553) SpO2:  [95 %-96 %] 95 % (09/03 1553)  Intake/Output from previous day:  Intake/Output Summary (Last 24 hours) at 06/21/2019 1608 Last data filed at 06/20/2019 2139 Gross per 24 hour  Intake 240 ml  Output -  Net 240 ml    Intake/Output this shift: No intake/output data recorded.  Labs: Recent Labs    06/20/19 0023 06/20/19 0623 06/21/19 0454  HGB 11.7* 11.7* 11.6*   Recent Labs    06/20/19 0623 06/21/19 0454  WBC 12.8* 14.8*  RBC 4.39 4.33  HCT 36.8 35.7*  PLT 225 196   Recent Labs    06/20/19 0623 06/21/19 0454  NA 139 136  K 4.0 3.3*  CL 105 101  CO2 24 26  BUN 10 9  CREATININE 0.75 0.67  GLUCOSE 132* 157*  CALCIUM 8.3* 8.6*   No results for input(s): LABPT, INR in the last 72 hours.   EXAM General - Patient is Alert, Appropriate and Oriented.  Spanish interpretation aided by tablet. Extremity - ABD soft Neurovascular intact Sensation intact distally Intact pulses distally Dorsiflexion/Plantar flexion intact Incision: dressing C/D/I Dressing/Incision - clean, dry, no drainage Motor Function - intact, moving foot and toes well on exam.   Past Medical History:  Diagnosis Date  . GERD (gastroesophageal reflux disease)   . Hyperlipidemia   . Hypertension   . Osteoarthritis of right knee      Assessment/Plan: 2 Days Post-Op Procedure(s) (LRB): TOTAL KNEE ARTHROPLASTY (Left) Active Problems:   Status post total knee replacement using cement, left  Estimated body mass index is 30.9 kg/m as calculated from the following:   Height as of this encounter: 5\' 4"  (1.626 m).   Weight as of this encounter: 81.6 kg. Advance diet Up with therapy   Labs reviewed this AM. WBC 14.8 this morning, continue to monitor.   CXR and UA have both been ordered for the patient. CBC and BMP ordered for tomorrow morning. Continue with PT today. Patient has had a BM. Plan for d/c home tomorrow.  DVT Prophylaxis - Lovenox, Foot Pumps and TED hose Weight-Bearing as tolerated to left leg  J. Cameron Proud, PA-C [facility] 06/21/2019, 4:08 PM

## 2019-06-21 NOTE — Progress Notes (Signed)
Physical Therapy Treatment Patient Details Name: Rose Roberts MRN: [id number] DOB: 03/12/1951 Today's Date: 06/21/2019    History of Present Illness Patient is 68 yo female s/p L TKA, WBAT. PMH of HTN, HLD.    PT Comments    In house interpreter Maryjane HurterOtto in for session.  Pt in bed, ready for session.  Pre-medicated prior to arrival.  Tolerated minimal exercises in supine with guarding and pain limiting ROM so progressed to gait.  Will defer stretching to pm session due to tolerance.  Min assist for LE management in and out of bed. Stood with min guard/assist for safety and inititially required min assist for gait due to poor step pattern and stepping too far into walker box causing post lean.  With cues and education she was able to correct gait and walk 160' with min guard.  Stair training was completed and she was able to go up/down 4 steps with increased time but no increased assist.  Voided upon return to room and declined remaining up in chair after session.   Follow Up Recommendations  Home health PT;Supervision for mobility/OOB     Equipment Recommendations  Rolling walker with 5" wheels;Other (comment)    Recommendations for Other Services       Precautions / Restrictions Precautions Precautions: Fall;Knee Restrictions Weight Bearing Restrictions: Yes LLE Weight Bearing: Weight bearing as tolerated    Mobility  Bed Mobility Overal bed mobility: Needs Assistance Bed Mobility: Supine to Sit;Sit to Supine     Supine to sit: Min assist Sit to supine: Min assist   General bed mobility comments: for LE management  Transfers Overall transfer level: Needs assistance Equipment used: Rolling walker (2 wheeled) Transfers: Sit to/from Stand Sit to Stand: Min guard         General transfer comment: good use of hands, min gaurd for general safety  Ambulation/Gait Ambulation/Gait assistance: Min guard;Min assist Gait Distance (Feet): 160 Feet Assistive device: Rolling  walker (2 wheeled) Gait Pattern/deviations: Step-through pattern Gait velocity: decreased   General Gait Details: initially min assist due to poor step pattern and walked placement but improved with cues and distance to min gaurd.   Stairs Stairs: Yes Stairs assistance: Min guard;Min assist Stair Management: Two rails;Step to pattern Number of Stairs: 4 General stair comments: pt hesitant but was able to go up.down with time and encouragement   Wheelchair Mobility    Modified Rankin (Stroke Patients Only)       Balance Overall balance assessment: Needs assistance Sitting-balance support: Feet supported;No upper extremity supported Sitting balance-Leahy Scale: Good     Standing balance support: Bilateral upper extremity supported Standing balance-Leahy Scale: Fair Standing balance comment: initially needs min assist but quickly progresses to min guard during session.                            Cognition Arousal/Alertness: Awake/alert Behavior During Therapy: WFL for tasks assessed/performed Overall Cognitive Status: Within Functional Limits for tasks assessed                                        Exercises Other Exercises Other Exercises: ankle pumps, quad sets and SLR x 10 - limited by pain.  Improved toderance with gait vs ex today.  Stretching deferred until pm session.    General Comments        Pertinent Vitals/Pain  Pain Assessment: Faces Faces Pain Scale: Hurts whole lot Pain Location: with ex in supine.  improved with gait. Pain Descriptors / Indicators: Aching;Guarding;Grimacing;Moaning Pain Intervention(s): Limited activity within patient's tolerance;Premedicated before session;Repositioned;Ice applied    Home Living                      Prior Function            PT Goals (current goals can now be found in the care plan section) Progress towards PT goals: Progressing toward goals    Frequency     BID      PT Plan Current plan remains appropriate    Co-evaluation              AM-PAC PT "6 Clicks" Mobility   Outcome Measure  Help needed turning from your back to your side while in a flat bed without using bedrails?: A Little Help needed moving from lying on your back to sitting on the side of a flat bed without using bedrails?: A Little Help needed moving to and from a bed to a chair (including a wheelchair)?: A Little Help needed standing up from a chair using your arms (e.g., wheelchair or bedside chair)?: A Little Help needed to walk in hospital room?: A Little Help needed climbing 3-5 steps with a railing? : A Little 6 Click Score: 18    End of Session Equipment Utilized During Treatment: Gait belt Activity Tolerance: Patient tolerated treatment well;Patient limited by fatigue Patient left: in bed;with call bell/phone within reach;with bed alarm set   Pain - Right/Left: Left Pain - part of body: Knee     Time: 6384-5364 PT Time Calculation (min) (ACUTE ONLY): 29 min  Charges:  $Gait Training: 8-22 mins $Therapeutic Exercise: 8-22 mins                    Chesley Noon, PTA 06/21/19, 1:23 PM

## 2019-06-22 LAB — URINALYSIS, COMPLETE (UACMP) WITH MICROSCOPIC
Bacteria, UA: NONE SEEN
Bilirubin Urine: NEGATIVE
Glucose, UA: NEGATIVE mg/dL
Hgb urine dipstick: NEGATIVE
Ketones, ur: NEGATIVE mg/dL
Leukocytes,Ua: NEGATIVE
Nitrite: NEGATIVE
Protein, ur: NEGATIVE mg/dL
Specific Gravity, Urine: 1.009 (ref 1.005–1.030)
pH: 7 (ref 5.0–8.0)

## 2019-06-22 LAB — CBC
HCT: 32.8 % — ABNORMAL LOW (ref 36.0–46.0)
Hemoglobin: 10.7 g/dL — ABNORMAL LOW (ref 12.0–15.0)
MCH: 26.6 pg (ref 26.0–34.0)
MCHC: 32.6 g/dL (ref 30.0–36.0)
MCV: 81.4 fL (ref 80.0–100.0)
Platelets: 190 10*3/uL (ref 150–400)
RBC: 4.03 MIL/uL (ref 3.87–5.11)
RDW: 14.3 % (ref 11.5–15.5)
WBC: 12.1 10*3/uL — ABNORMAL HIGH (ref 4.0–10.5)
nRBC: 0 % (ref 0.0–0.2)

## 2019-06-22 LAB — BASIC METABOLIC PANEL
Anion gap: 8 (ref 5–15)
BUN: 9 mg/dL (ref 8–23)
CO2: 27 mmol/L (ref 22–32)
Calcium: 8.1 mg/dL — ABNORMAL LOW (ref 8.9–10.3)
Chloride: 102 mmol/L (ref 98–111)
Creatinine, Ser: 0.68 mg/dL (ref 0.44–1.00)
GFR calc Af Amer: >60 mL/min (ref >60)
GFR calc non Af Amer: >60 mL/min (ref >60)
Glucose, Bld: 159 mg/dL — ABNORMAL HIGH (ref 70–99)
Potassium: 3.1 mmol/L — ABNORMAL LOW (ref 3.5–5.1)
Sodium: 137 mmol/L (ref 135–145)

## 2019-06-22 MED ORDER — POTASSIUM CHLORIDE CRYS ER 20 MEQ PO TBCR
40.0000 meq | EXTENDED_RELEASE_TABLET | Freq: Once | ORAL | Status: AC
  Administered 2019-06-22: 09:00:00 40 meq via ORAL
  Filled 2019-06-22: qty 2

## 2019-06-22 NOTE — Progress Notes (Signed)
Subjective: 3 Days Post-Op Procedure(s) (LRB): TOTAL KNEE ARTHROPLASTY (Left) Patient reports pain as 6 on 0-10 scale. Patient has completed all steps required with PT. Patient is well, and has had no acute complaints or problems Current plan is for discharge home with HHPT. Negative for chest pain and shortness of breath Fever: Yes, most recent temp of 100.7. Gastrointestinal:Negative for nausea and vomiting  Objective: Vital signs in last 24 hours: Temp:  [100.2 F (37.9 C)-100.7 F (38.2 C)] 100.2 F (37.9 C) (09/03 2348) Pulse Rate:  [96-98] 96 (09/03 2348) Resp:  [19] 19 (09/03 2348) BP: (157)/(77-78) 157/78 (09/03 2348) SpO2:  [93 %-95 %] 93 % (09/03 2348)  Intake/Output from previous day:  Intake/Output Summary (Last 24 hours) at 06/22/2019 0801 Last data filed at 06/22/2019 0512 Gross per 24 hour  Intake 480 ml  Output 400 ml  Net 80 ml    Intake/Output this shift: No intake/output data recorded.  Labs: Recent Labs    06/20/19 0023 06/20/19 0623 06/21/19 0454 06/22/19 0414  HGB 11.7* 11.7* 11.6* 10.7*   Recent Labs    06/21/19 0454 06/22/19 0414  WBC 14.8* 12.1*  RBC 4.33 4.03  HCT 35.7* 32.8*  PLT 196 190   Recent Labs    06/21/19 0454 06/22/19 0414  NA 136 137  K 3.3* 3.1*  CL 101 102  CO2 26 27  BUN 9 9  CREATININE 0.67 0.68  GLUCOSE 157* 159*  CALCIUM 8.6* 8.1*   No results for input(s): LABPT, INR in the last 72 hours.   EXAM General - Patient is Alert, Appropriate and Oriented.  Spanish interpretation aided by tablet. Extremity - ABD soft Neurovascular intact Sensation intact distally Intact pulses distally Dorsiflexion/Plantar flexion intact Incision: dressing C/D/I  Negative Homan's to the left lower extremity this AM. Dressing/Incision - clean, dry, no drainage Motor Function - intact, moving foot and toes well on exam.   Past Medical History:  Diagnosis Date  . GERD (gastroesophageal reflux disease)   . Hyperlipidemia    . Hypertension   . Osteoarthritis of right knee     Assessment/Plan: 3 Days Post-Op Procedure(s) (LRB): TOTAL KNEE ARTHROPLASTY (Left) Active Problems:   Status post total knee replacement using cement, left  Estimated body mass index is 30.9 kg/m as calculated from the following:   Height as of this encounter: 5\' 4"  (1.626 m).   Weight as of this encounter: 81.6 kg. Advance diet Up with therapy   Labs reviewed this AM. K+ 3.1 this AM, will supplement this morning. WBC 12.1, WBC decreased this morning compared to yesterday.  UA negative for UTI, CXR does show nonspecific findings, encouraged incentive spirometer.  WBC has decreased. Continue with PT today. Patient has had a BM. Plan for d/c home this afternoon.  DVT Prophylaxis - Lovenox, Foot Pumps and TED hose Weight-Bearing as tolerated to left leg  J. Cameron Proud, PA-C [facility] 06/22/2019, 8:01 AM

## 2019-06-22 NOTE — Progress Notes (Signed)
Pt discharged home with hard rx, translator was present to explain how to administer lovenox injections, Pt verbalized understanding. Son came to pick her up without incident

## 2019-06-22 NOTE — Discharge Instructions (Signed)
Diet: As you were doing prior to hospitalization   Shower:  May shower but keep the wounds dry, use an occlusive plastic wrap, NO SOAKING IN TUB.  If the bandage gets wet, change with a clean dry gauze.  Dressing:  You may change your dressing as needed. Change the dressing with sterile gauze dressing.    Activity:  Increase activity slowly as tolerated, but follow the weight bearing instructions below.  No lifting or driving for 6 weeks.  Weight Bearing:  Weightbearing as tolerated to the left leg.  To prevent constipation: you may use a stool softener such as -  Colace (over the counter) 100 mg by mouth twice a day  Drink plenty of fluids (prune juice may be helpful) and high fiber foods Miralax (over the counter) for constipation as needed.    Itching:  If you experience itching with your medications, try taking only a single pain pill, or even half a pain pill at a time.  You may take up to 10 pain pills per day, and you can also use benadryl over the counter for itching or also to help with sleep.   Precautions:  If you experience chest pain or shortness of breath - call 911 immediately for transfer to the hospital emergency department!!  If you develop a fever greater that 101 F, purulent drainage from wound, increased redness or drainage from wound, or calf pain-Call [facility]                                              Follow- Up Appointment:  Please call for an appointment to be seen in 2 weeks at [facility]  

## 2019-06-22 NOTE — Care Management Important Message (Signed)
Important Message  Patient Details  Name: Rose Roberts MRN: [id number] Date of Birth: 08-Jan-1951   Medicare Important Message Given:  Yes     Dannette Barbara 06/22/2019, 11:39 AM

## 2019-06-22 NOTE — Discharge Summary (Signed)
Physician Discharge Summary  Patient ID: Rose Roberts MRN: [id number] DOB/AGE: 1951/10/03 68 y.o.  Admit date: 06/19/2019 Discharge date: 06/22/2019  Admission Diagnoses:  PRIMARY OSTEOARTHRITIS OF LEFT KNEE.  Discharge Diagnoses: Patient Active Problem List   Diagnosis Date Noted  . Status post total knee replacement using cement, left 06/19/2019  . Status post total knee replacement using cement, right 11/23/2018  . Varicose veins of bilateral lower extremities with pain 09/30/2016  . Chronic venous insufficiency 09/30/2016  . Pain in limb 09/30/2016    Past Medical History:  Diagnosis Date  . GERD (gastroesophageal reflux disease)   . Hyperlipidemia   . Hypertension   . Osteoarthritis of right knee      Transfusion: None.   Consultants (if any):   Discharged Condition: Improved  Hospital Course: Rose Roberts is an 68 y.o. female who was admitted 06/19/2019 with a diagnosis of left knee osteoarthritis and went to the operating room on 06/19/2019 and underwent the above named procedures.    Surgeries: Procedure(s): TOTAL KNEE ARTHROPLASTY on 06/19/2019 Patient tolerated the surgery well. Taken to PACU where she was stabilized and then transferred to the orthopedic floor.  Started on Lovenox 40mg  q 24 hrs. Foot pumps applied bilaterally at 80 mm. Heels elevated on bed with rolled towels. No evidence of DVT. Negative Homan. Physical therapy started on day #1 for gait training and transfer. OT started day #1 for ADL and assisted devices.  Patient's IV was removed on POD3.  Foley was removed on POD1.  Implants:  Left TKA using all-cemented [employer] Vanguard system with a 65 mm PCR femur, a 71 mm tibial tray with a 12 mm anterior stabilized e-poly insert, and a 31 x 8 mm all-poly 3-pegged domed patella.  She was given perioperative antibiotics:  Anti-infectives (From admission, onward)   Start     Dose/Rate Route Frequency Ordered Stop   06/19/19 1400  ceFAZolin (ANCEF)  IVPB 2g/100 mL premix     2 g 200 mL/hr over 30 Minutes Intravenous Every 6 hours 06/19/19 1059 06/20/19 0158   06/19/19 0608  ceFAZolin (ANCEF) 2-4 GM/100ML-% IVPB    Note to Pharmacy: Mikey Bussing   : cabinet override      06/19/19 0608 06/19/19 0748   06/18/19 2245  ceFAZolin (ANCEF) IVPB 2g/100 mL premix     2 g 200 mL/hr over 30 Minutes Intravenous  Once 06/18/19 2244 06/19/19 0748    .  She was given sequential compression devices, early ambulation, and Lovenox for DVT prophylaxis.  She benefited maximally from the hospital stay and there were no complications.    Recent vital signs:  Vitals:   06/21/19 1553 06/21/19 2348  BP: (!) 157/77 (!) 157/78  Pulse: 98 96  Resp:  19  Temp: (!) 100.7 F (38.2 C) 100.2 F (37.9 C)  SpO2: 95% 93%    Recent laboratory studies:  Lab Results  Component Value Date   HGB 10.7 (L) 06/22/2019   HGB 11.6 (L) 06/21/2019   HGB 11.7 (L) 06/20/2019   Lab Results  Component Value Date   WBC 12.1 (H) 06/22/2019   PLT 190 06/22/2019   No results found for: INR Lab Results  Component Value Date   NA 137 06/22/2019   K 3.1 (L) 06/22/2019   CL 102 06/22/2019   CO2 27 06/22/2019   BUN 9 06/22/2019   CREATININE 0.68 06/22/2019   GLUCOSE 159 (H) 06/22/2019    Discharge Medications:   Allergies as of 06/22/2019  No Known Allergies     Medication List    TAKE these medications   atorvastatin 20 MG tablet Commonly known as: LIPITOR Take 20 mg by mouth at bedtime.   enoxaparin 40 MG/0.4ML injection Commonly known as: LOVENOX Inject 0.4 mLs (40 mg total) into the skin daily.   hydrochlorothiazide 12.5 MG capsule Commonly known as: MICROZIDE Take 12.5 mg by mouth daily with lunch.   meloxicam 15 MG tablet Commonly known as: MOBIC Take 15 mg by mouth daily as needed for pain.   multivitamin with minerals Tabs tablet Take 1 tablet by mouth daily.   oxyCODONE 5 MG immediate release tablet Commonly known as: Oxy  IR/ROXICODONE Take 1-2 tablets (5-10 mg total) by mouth every 4 (four) hours as needed for moderate pain.   traMADol 50 MG tablet Commonly known as: ULTRAM Take 1 tablet (50 mg total) by mouth every 6 (six) hours as needed for moderate pain.            Durable Medical Equipment  (From admission, onward)         Start     Ordered   06/19/19 1100  DME Bedside commode  Once    Question:  Patient needs a bedside commode to treat with the following condition  Answer:  Status post total knee replacement using cement, left   06/19/19 1059   06/19/19 1100  DME 3 n 1  Once     06/19/19 1059   06/19/19 1100  DME Walker rolling  Once    Question:  Patient needs a walker to treat with the following condition  Answer:  Status post total knee replacement using cement, left   06/19/19 1059          Diagnostic Studies: [place] Venous Img Lower Unilateral Left  Result Date: 06/21/2019 CLINICAL DATA:  Left calf pain. EXAM: LEFT LOWER EXTREMITY VENOUS DOPPLER ULTRASOUND TECHNIQUE: Gray-scale sonography with graded compression, as well as color Doppler and duplex ultrasound were performed to evaluate the lower extremity deep venous systems from the level of the common femoral vein and including the common femoral, femoral, profunda femoral, popliteal and calf veins including the posterior tibial, peroneal and gastrocnemius veins when visible. The superficial great saphenous vein was also interrogated. Spectral Doppler was utilized to evaluate flow at rest and with distal augmentation maneuvers in the common femoral, femoral and popliteal veins. COMPARISON:  No recent prior. FINDINGS: Contralateral Common Femoral Vein: Respiratory phasicity is normal and symmetric with the symptomatic side. No evidence of thrombus. Normal compressibility. Common Femoral Vein: No evidence of thrombus. Normal compressibility, respiratory phasicity and response to augmentation. Saphenofemoral Junction: No evidence of thrombus.  Normal compressibility and flow on color Doppler imaging. Profunda Femoral Vein: No evidence of thrombus. Normal compressibility and flow on color Doppler imaging. Femoral Vein: No evidence of thrombus. Normal compressibility, respiratory phasicity and response to augmentation. Popliteal Vein: No evidence of thrombus. Normal compressibility, respiratory phasicity and response to augmentation. Calf Veins: No evidence of thrombus. Normal compressibility and flow on color Doppler imaging. Other Findings:  None. IMPRESSION: No evidence of deep venous thrombosis. Electronically Signed   By: Maisie Fushomas  Register   On: 06/21/2019 05:48   Dg Chest Port 1 View  Result Date: 06/21/2019 CLINICAL DATA:  Fever. EXAM: PORTABLE CHEST 1 VIEW COMPARISON:  None. FINDINGS: Normal heart size. Decreased lung volumes. There is no pleural effusion or edema identified. Mild increase reticulonodular opacities within the left lung and right upper lobe are identified. No lobar  consolidation identified. IMPRESSION: 1. Mild reticular nodular interstitial opacities are noted bilaterally which may reflect atypical inflammation or infection. No lobar consolidation. Electronically Signed   By: Kerby Moors M.D.   On: 06/21/2019 08:57   Dg Knee Left Port  Result Date: 06/19/2019 CLINICAL DATA:  Left total knee arthroplasty. EXAM: PORTABLE LEFT KNEE - 1-2 VIEW COMPARISON:  Left knee x-rays dated Mar 15, 2013. FINDINGS: The left knee demonstrates a total knee arthroplasty without evidence of hardware failure or complication. There is expected intra-articular air. There is no fracture or dislocation. The alignment is anatomic. Post-surgical changes noted in the surrounding soft tissues. IMPRESSION: 1. Interval left total knee arthroplasty without evidence of acute postoperative complication. Electronically Signed   By: Titus Dubin M.D.   On: 06/19/2019 10:50   Disposition: Plan for discharge this afternoon after working with PT.  Follow-up  Information    Lattie Corns, PA-C Follow up in 14 day(s).   Specialty: Physician Assistant Why: Electa Sniff information: [place] [address] [phone]          Signed: Judson Roch PA-C 06/22/2019, 8:07 AM

## 2020-03-09 IMAGING — MG DIGITAL DIAGNOSTIC UNILATERAL LEFT MAMMOGRAM WITH TOMO AND CAD
4 series · 4 of 12 positions shown · non-contrast
Comparison: Previous exam(s).

CLINICAL DATA: The patient was called back for a left breast mass

EXAM:
DIGITAL DIAGNOSTIC LEFT MAMMOGRAM WITH CAD AND TOMO
ULTRASOUND LEFT BREAST

[L CC synth-2D]
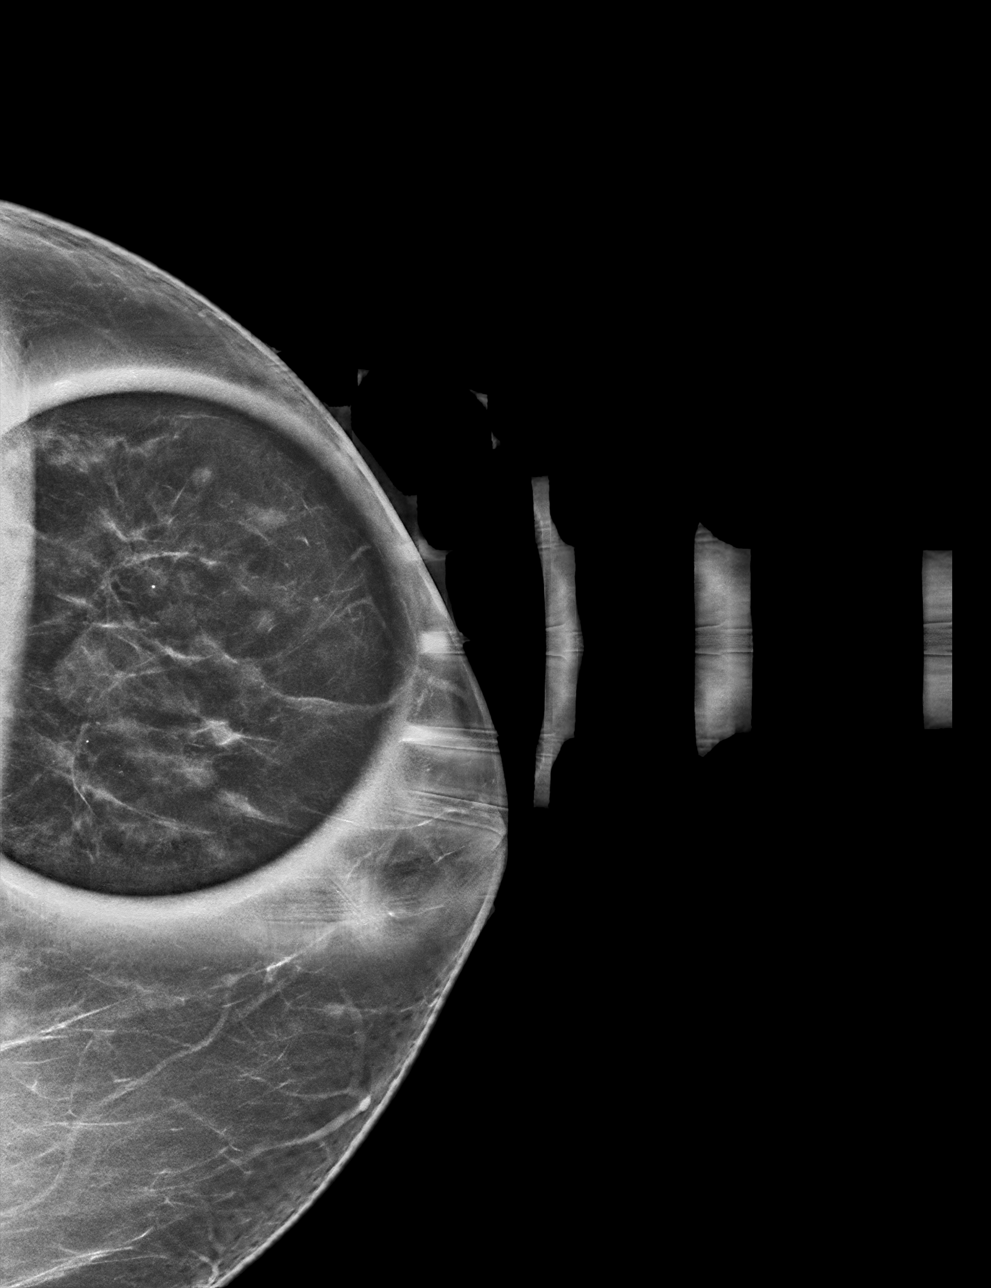

[L MLO synth-2D]
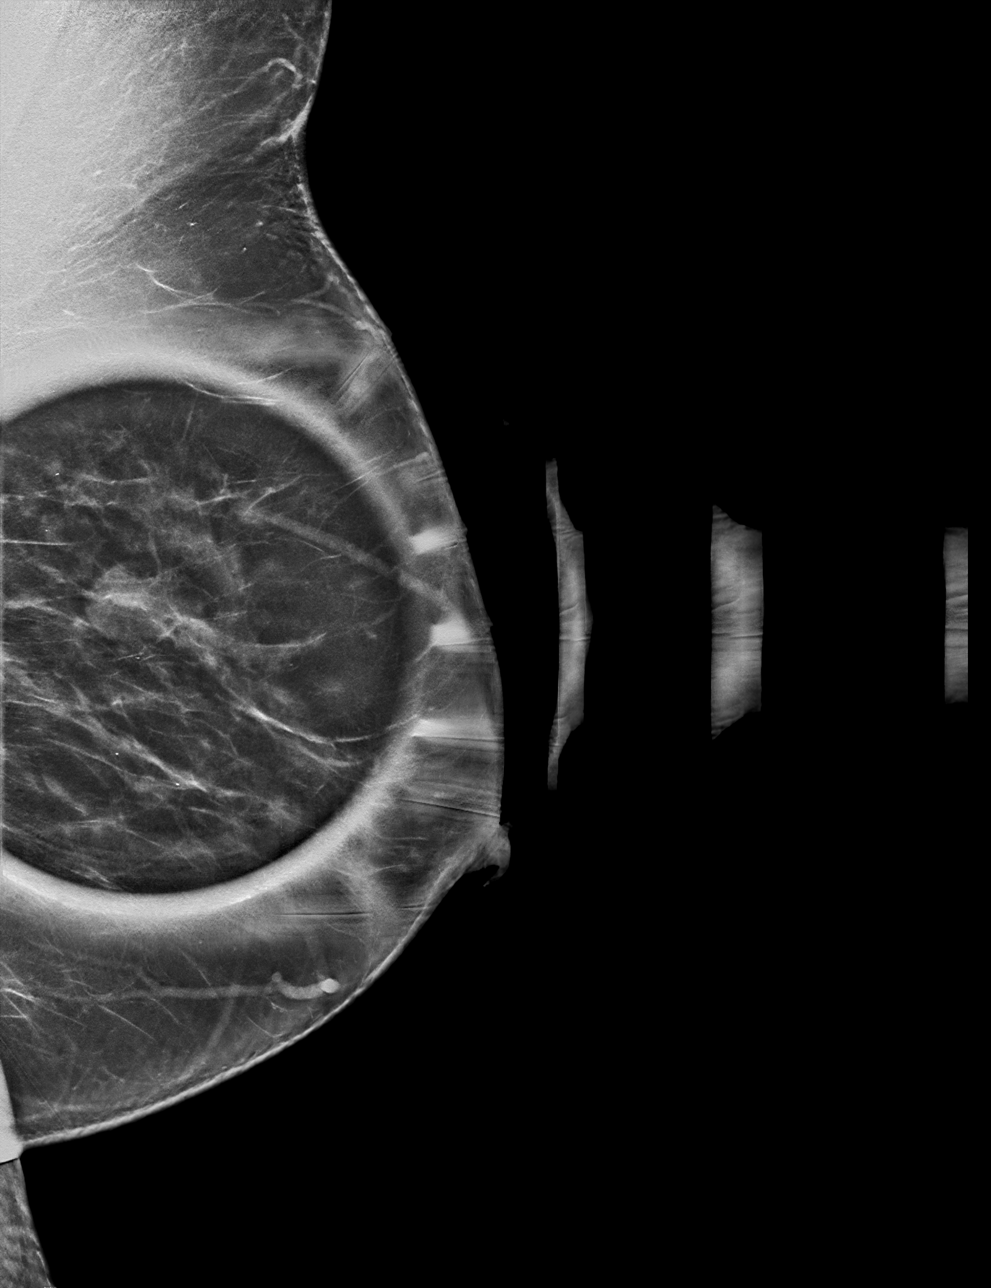

[L MLO tomo · tomo slice 35/68.0]
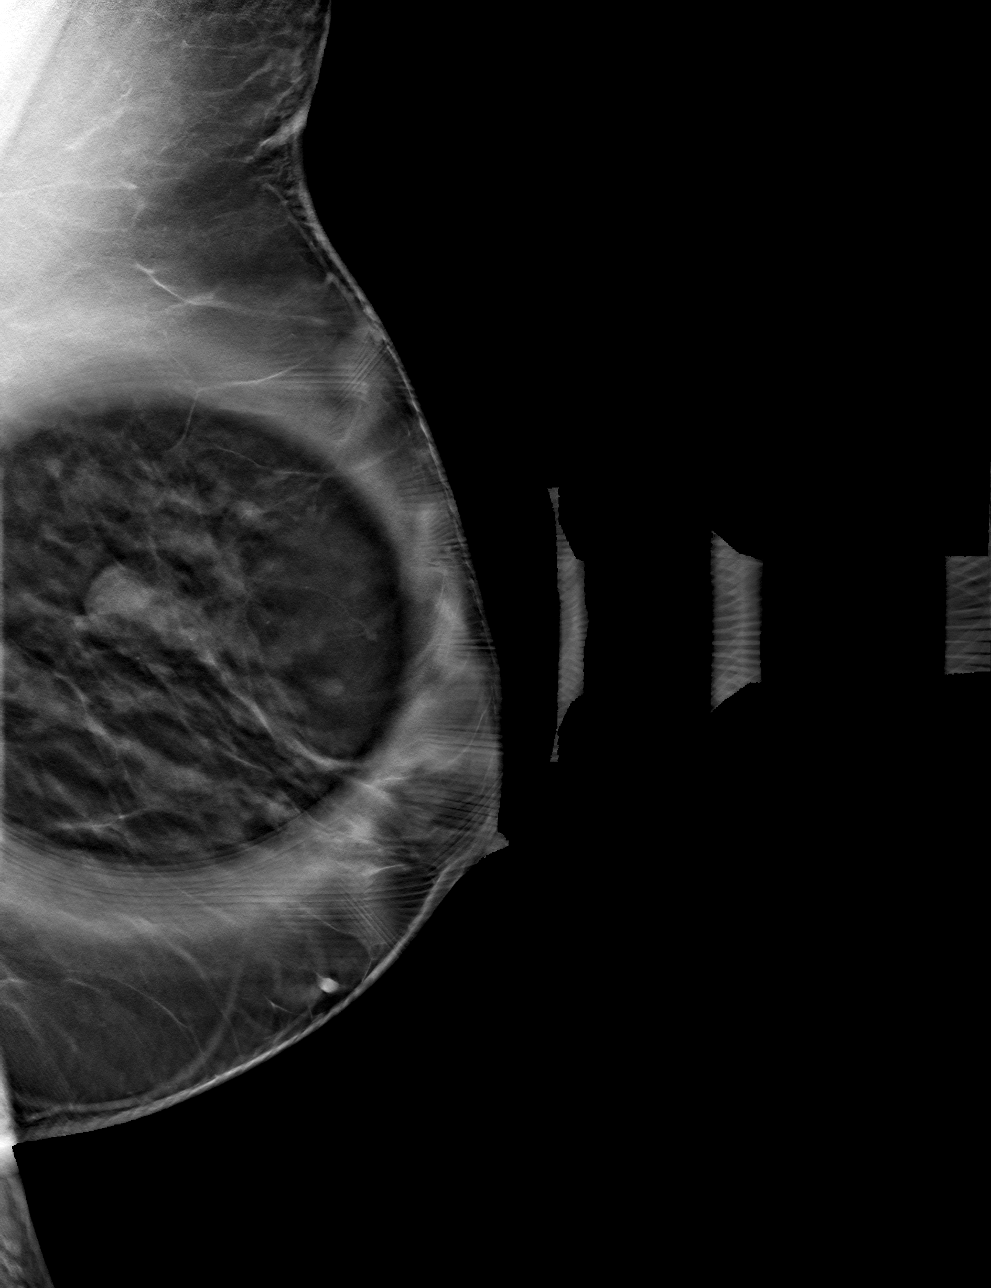

[L CC tomo · tomo slice 30/59.0]
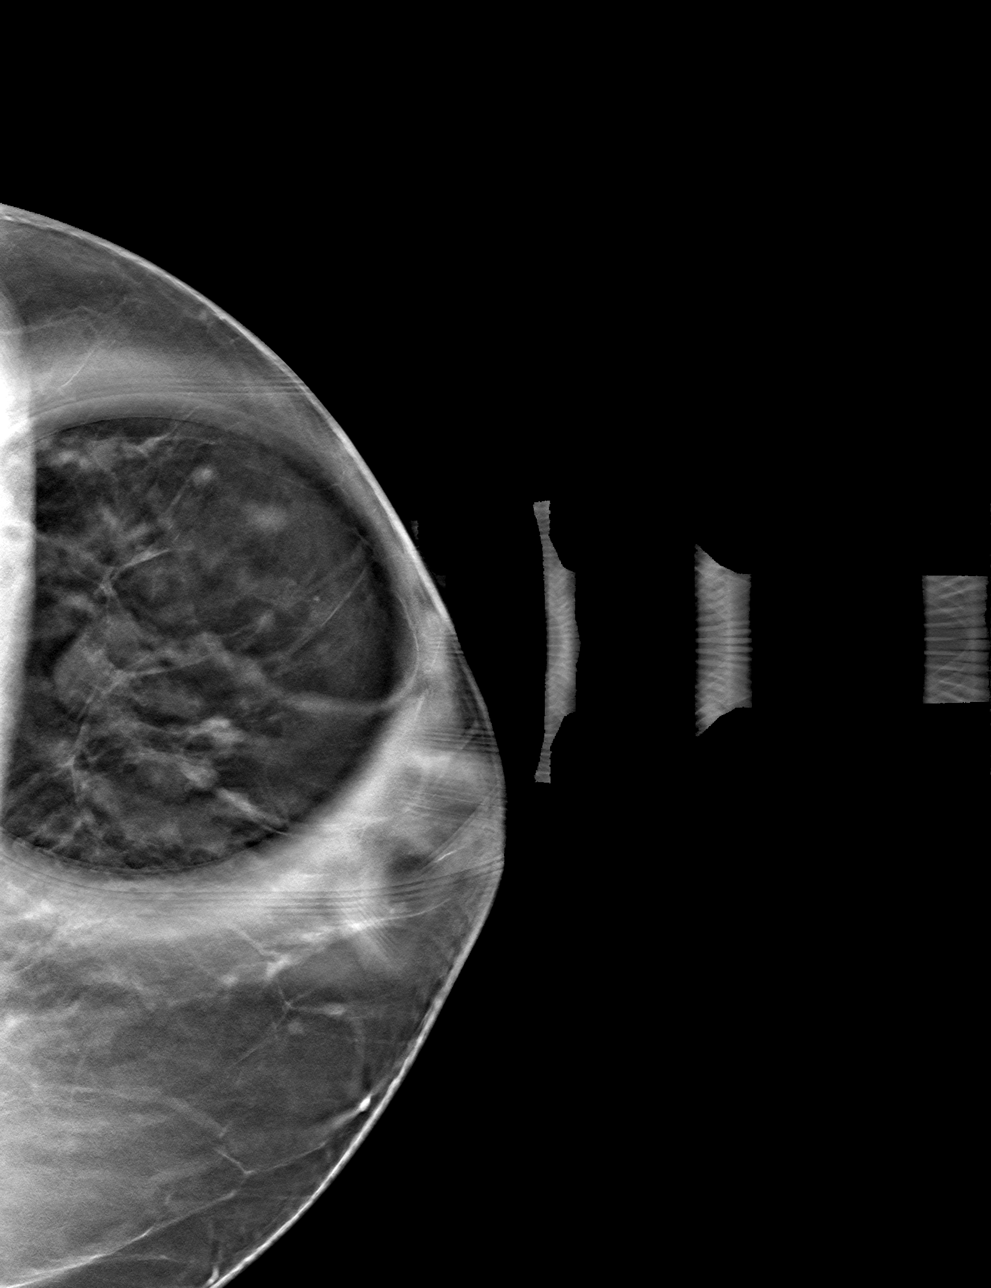

[4 of 12 positions shown; findings below may reference images not displayed]

ACR Breast Density Category b: There are scattered areas of
fibroglandular density.
FINDINGS: The mass in the upper outer left breast persists on additional
imaging.

Mammographic images were processed with CAD.

On physical exam, no suspicious lumps are identified.

Targeted ultrasound is performed, showing a dominant cyst in the
left breast at 2 o'clock, 4 cm from the nipple explaining the
mammographic findings. Multiple smaller surrounding cysts are seen
as well.
IMPRESSION: Fibrocystic changes.  No evidence of malignancy in the left breast.

RECOMMENDATION:
Annual screening mammography.

I have discussed the findings and recommendations with the patient.
Results were also provided in writing at the conclusion of the
visit. If applicable, a reminder letter will be sent to the patient
regarding the next appointment.

BI-RADS CATEGORY  2: Benign.

## 2020-03-09 IMAGING — US ULTRASOUND LEFT BREAST LIMITED
1 series · 7 of 7 positions shown · non-contrast
Comparison: Previous exam(s).

CLINICAL DATA: The patient was called back for a left breast mass

EXAM:
DIGITAL DIAGNOSTIC LEFT MAMMOGRAM WITH CAD AND TOMO
ULTRASOUND LEFT BREAST

[Series 1: ultrasound left breast limited · 0.07mm/px · 7 of 7 slices shown]
[im 1/7]
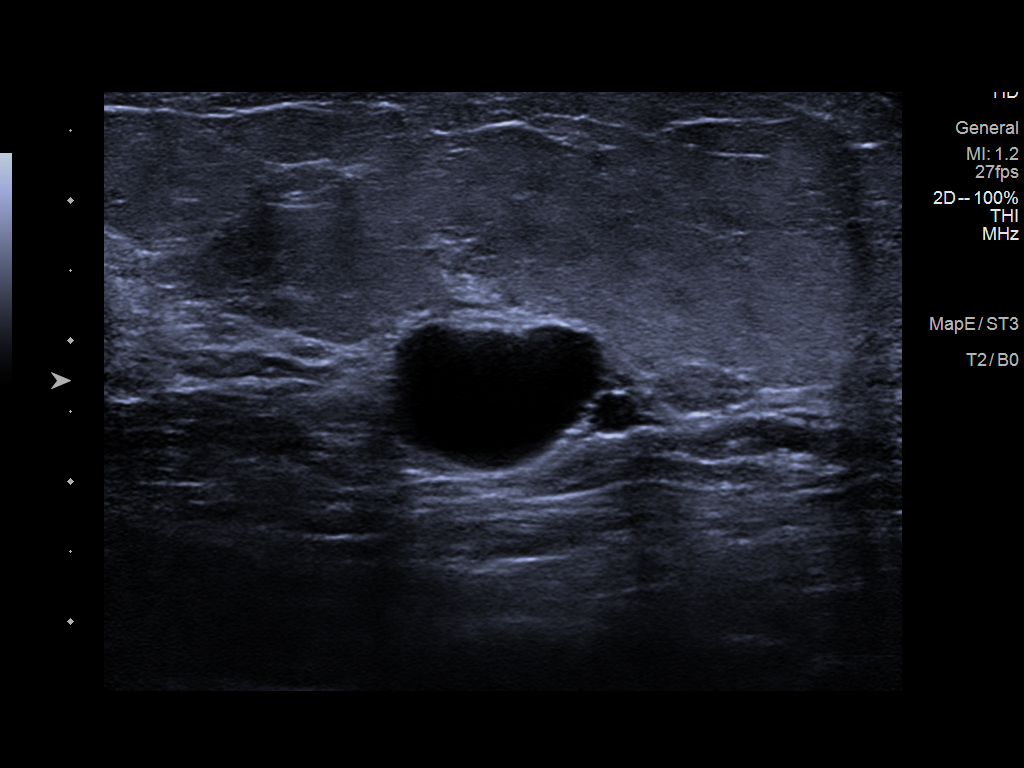
[im 2/7]
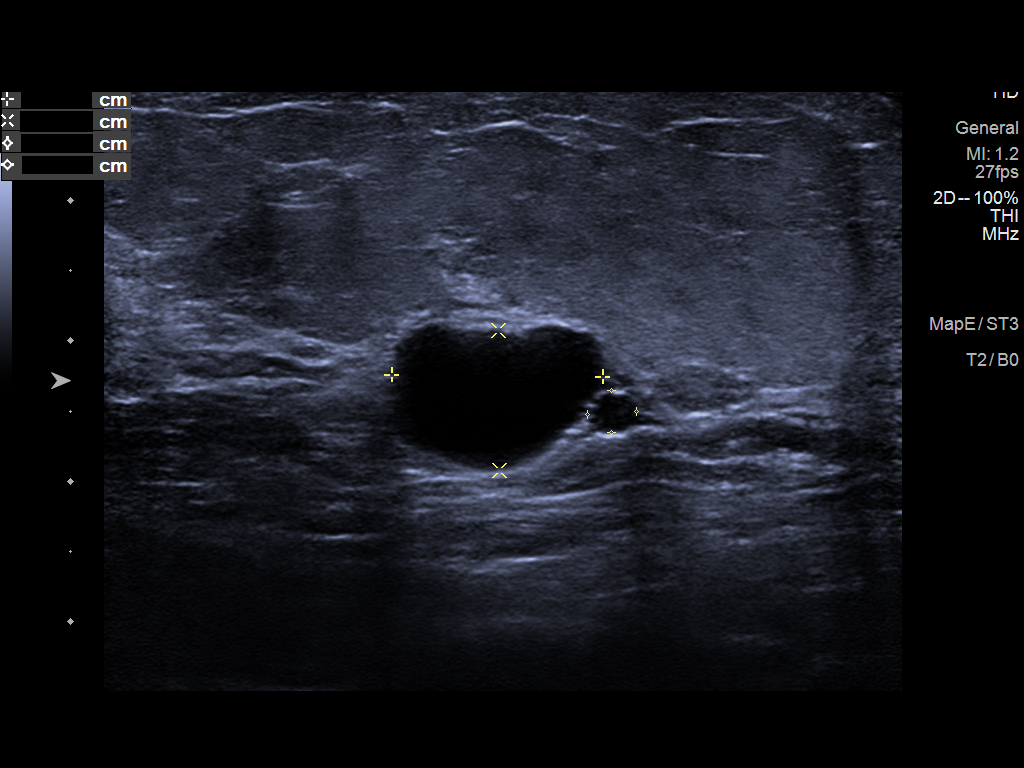
[im 3/7]
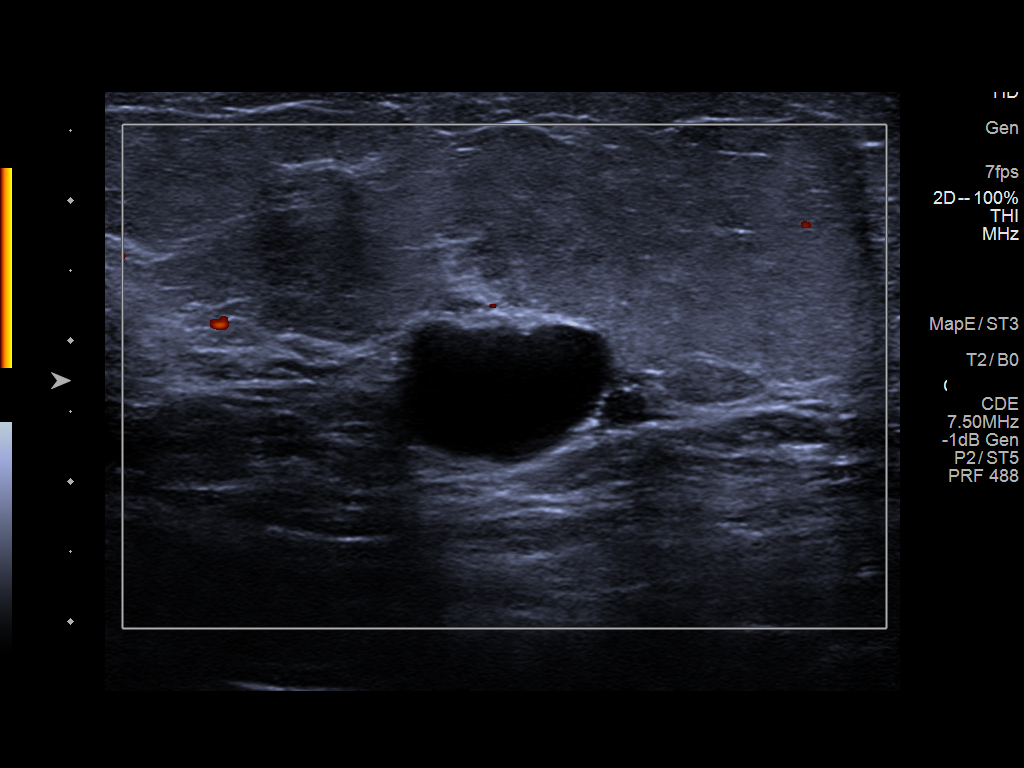
[im 4/7]
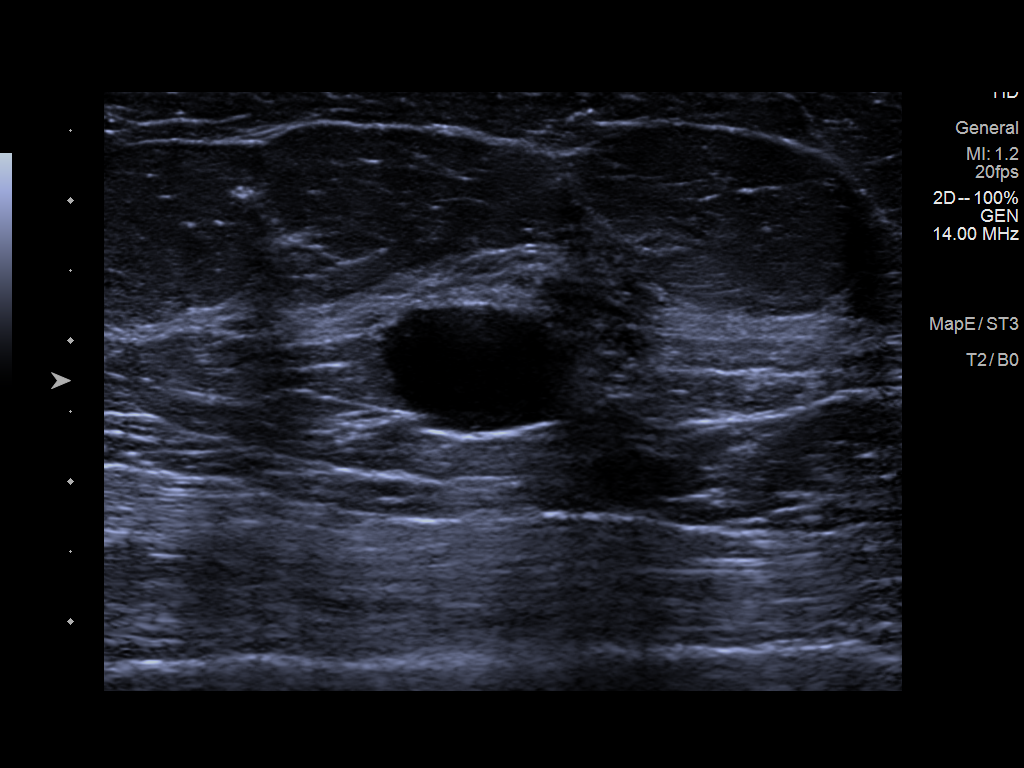
[im 5/7]
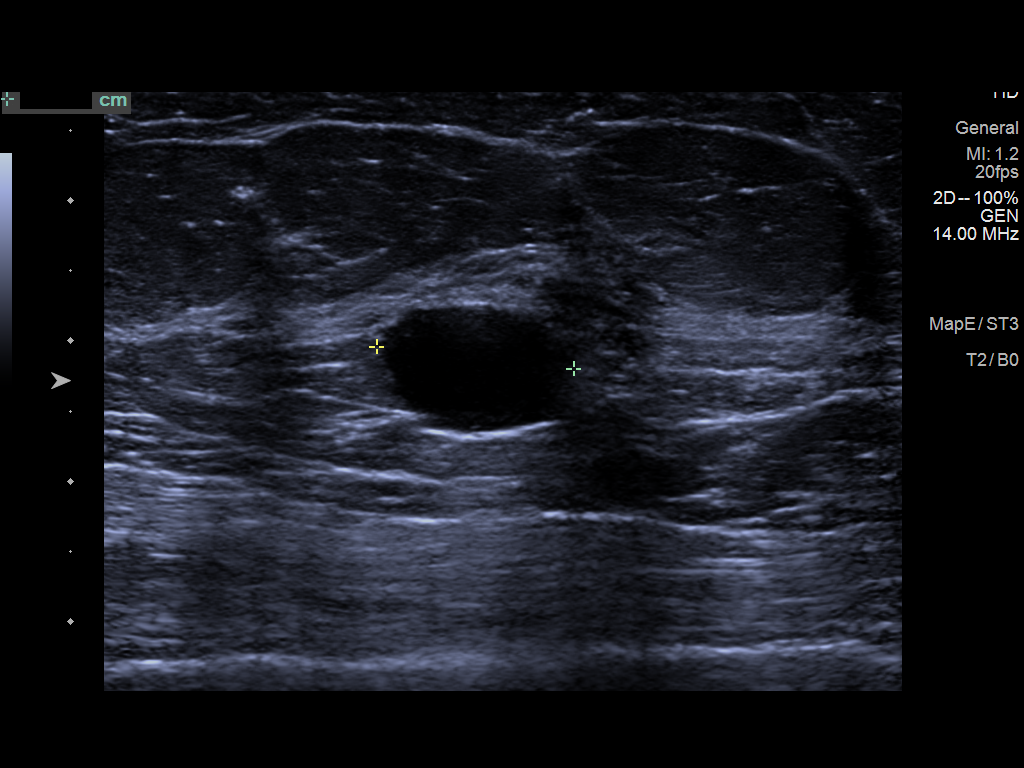
[im 6/7]
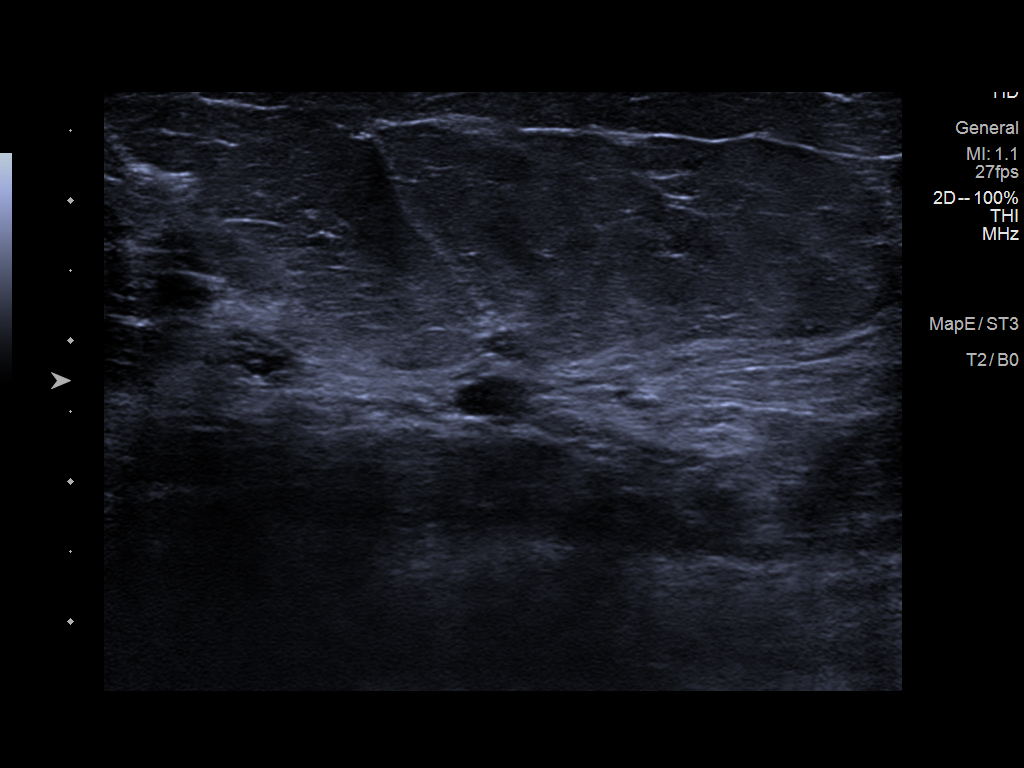
[im 7/7]
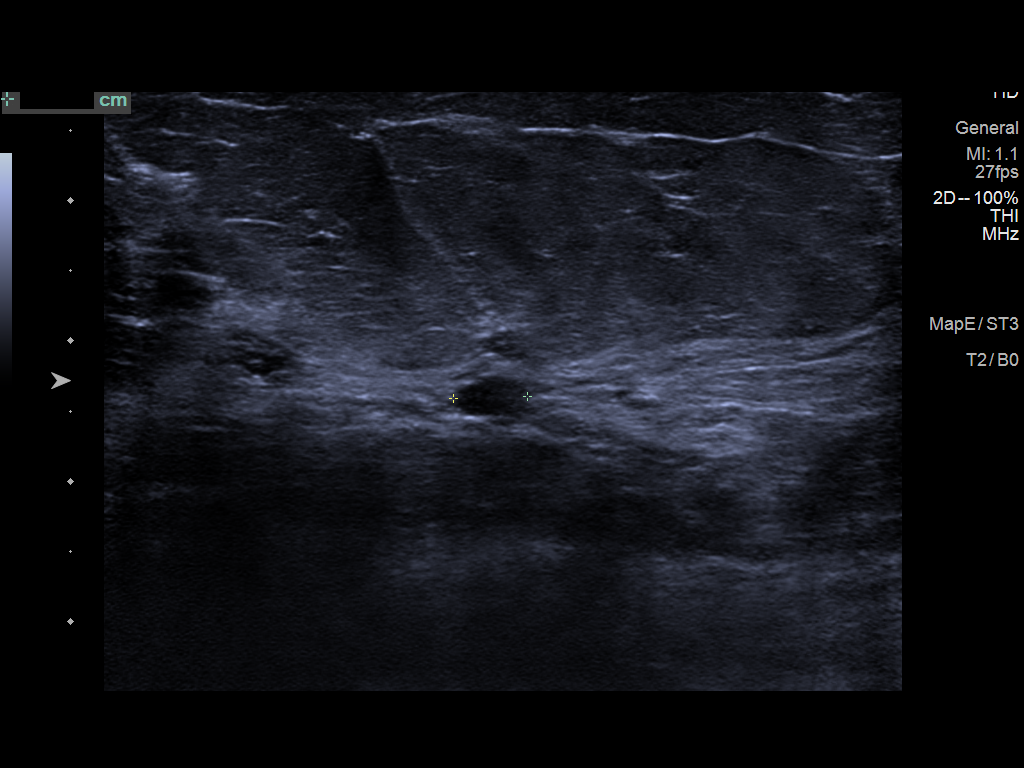

[7 of 7 positions shown; findings below may reference images not displayed]

ACR Breast Density Category b: There are scattered areas of
fibroglandular density.
FINDINGS: The mass in the upper outer left breast persists on additional
imaging.

Mammographic images were processed with CAD.

On physical exam, no suspicious lumps are identified.

Targeted ultrasound is performed, showing a dominant cyst in the
left breast at 2 o'clock, 4 cm from the nipple explaining the
mammographic findings. Multiple smaller surrounding cysts are seen
as well.
IMPRESSION: Fibrocystic changes.  No evidence of malignancy in the left breast.

RECOMMENDATION:
Annual screening mammography.

I have discussed the findings and recommendations with the patient.
Results were also provided in writing at the conclusion of the
visit. If applicable, a reminder letter will be sent to the patient
regarding the next appointment.

BI-RADS CATEGORY  2: Benign.

## 2020-05-20 ENCOUNTER — Telehealth (INDEPENDENT_AMBULATORY_CARE_PROVIDER_SITE_OTHER): Payer: Self-pay | Admitting: Vascular Surgery

## 2020-05-20 NOTE — Telephone Encounter (Signed)
Patient has being schedule 

## 2020-05-20 NOTE — Telephone Encounter (Signed)
Providers office called this morning requesting that patient come in to be seen based on her last appt with them on 05/12/20. PCP stated that the patients veins are inflamed. Patient told PCP that she has a burning sensation in her legs, it has been ongoing for about a month. Provider would like for her to come in to be seen. Patient was last seen 08/11/17 (sclerotherapy) GS. Please advise

## 2020-05-20 NOTE — Telephone Encounter (Signed)
She can come in with bilateral reflux..me or GS in the next few weeks

## 2020-05-30 ENCOUNTER — Other Ambulatory Visit (INDEPENDENT_AMBULATORY_CARE_PROVIDER_SITE_OTHER): Payer: Self-pay | Admitting: Nurse Practitioner

## 2020-05-30 DIAGNOSIS — I8311 Varicose veins of right lower extremity with inflammation: Secondary | ICD-10-CM

## 2020-05-30 DIAGNOSIS — I8312 Varicose veins of left lower extremity with inflammation: Secondary | ICD-10-CM

## 2020-06-02 ENCOUNTER — Encounter (INDEPENDENT_AMBULATORY_CARE_PROVIDER_SITE_OTHER): Payer: Self-pay | Admitting: Nurse Practitioner

## 2020-06-02 ENCOUNTER — Other Ambulatory Visit: Payer: Self-pay

## 2020-06-02 ENCOUNTER — Ambulatory Visit (INDEPENDENT_AMBULATORY_CARE_PROVIDER_SITE_OTHER): Payer: Medicare Other | Admitting: Nurse Practitioner

## 2020-06-02 ENCOUNTER — Ambulatory Visit (INDEPENDENT_AMBULATORY_CARE_PROVIDER_SITE_OTHER): Payer: Medicare Other

## 2020-06-02 VITALS — BP 158/88 | HR 76 | Resp 16 | Ht 60.0 in | Wt 199.0 lb

## 2020-06-02 DIAGNOSIS — M1712 Unilateral primary osteoarthritis, left knee: Secondary | ICD-10-CM | POA: Diagnosis not present

## 2020-06-02 DIAGNOSIS — I83813 Varicose veins of bilateral lower extremities with pain: Secondary | ICD-10-CM

## 2020-06-02 DIAGNOSIS — I8312 Varicose veins of left lower extremity with inflammation: Secondary | ICD-10-CM

## 2020-06-02 DIAGNOSIS — M1710 Unilateral primary osteoarthritis, unspecified knee: Secondary | ICD-10-CM | POA: Insufficient documentation

## 2020-06-02 DIAGNOSIS — M179 Osteoarthritis of knee, unspecified: Secondary | ICD-10-CM | POA: Insufficient documentation

## 2020-06-02 DIAGNOSIS — I8311 Varicose veins of right lower extremity with inflammation: Secondary | ICD-10-CM | POA: Diagnosis not present

## 2020-06-02 NOTE — Progress Notes (Signed)
Subjective:    Patient ID: Rose Roberts, female    DOB: 05/10/1951, 69 y.o.   MRN: [id number] Chief Complaint  Patient presents with  . Follow-up    ultrasound    The patient returns to the office for followup status post laser ablation of the bilateral great saphenous veins in 2017 and 2018. The patient notes recently developing burning stinging and tenderness over a large varicosity on her left lower extremity.  These varicosities continued to hurt with dependent positions and remained tender to palpation. The patient's swelling is unchanged from preoperative status. The patient continues to wear graduated compression stockings on a daily basis but these are not eliminating the pain and discomfort. The patient continues to use over-the-counter anti-inflammatory medications to treat the pain and related symptoms but this has not given the patient relief. The patient notes the pain in the lower extremities is causing problems with daily exercise, problems at work and even with household activities such as preparing meals and doing dishes.  The patient also has difficulty with sleeping due to the extreme sensitivity.  The patient is otherwise done well and there have been no complications related to the laser procedure or interval changes in the patient's overall   Venous ultrasound post laser shows successful laser ablation of the left lower extremity, no DVT identified.  However, it is noted that there is a large left anterior accessory saphenous vein with evidence of reflux from the saphenofemoral junction to the mid thigh in it extends from the anterior leg to the lateral thigh.   Review of Systems  Cardiovascular: Positive for leg swelling.  Musculoskeletal: Positive for arthralgias.  All other systems reviewed and are negative.      Objective:   Physical Exam Vitals reviewed. Exam conducted with a chaperone present (Interpreter present).  HENT:     Head: Normocephalic.    Cardiovascular:     Rate and Rhythm: Normal rate and regular rhythm.     Pulses: Normal pulses.     Heart sounds: Normal heart sounds.     Comments: Large 4-5 mm varicosity extending from groin across thigh and down lateral leg Pulmonary:     Effort: Pulmonary effort is normal.     Breath sounds: Normal breath sounds.  Neurological:     Mental Status: She is alert and oriented to person, place, and time.  Psychiatric:        Mood and Affect: Mood normal.        Behavior: Behavior normal.        Thought Content: Thought content normal.        Judgment: Judgment normal.     BP (!) 158/88 (BP Location: Right Arm)   Pulse 76   Resp 16   Ht 5' (1.524 m)   Wt 199 lb (90.3 kg)   BMI 38.86 kg/m   Past Medical History:  Diagnosis Date  . GERD (gastroesophageal reflux disease)   . Hyperlipidemia   . Hypertension   . Osteoarthritis of right knee     Social History   Socioeconomic History  . Marital status: Divorced    Spouse name: Not on file  . Number of children: Not on file  . Years of education: Not on file  . Highest education level: Not on file  Occupational History  . Not on file  Tobacco Use  . Smoking status: Never Smoker  . Smokeless tobacco: Never Used  Vaping Use  . Vaping Use: Never used  Substance  and Sexual Activity  . Alcohol use: No  . Drug use: No  . Sexual activity: Not on file  Other Topics Concern  . Not on file  Social History Narrative  . Not on file   Social Determinants of Health   Financial Resource Strain:   . Difficulty of Paying Living Expenses:   Food Insecurity:   . Worried About Programme researcher, broadcasting/film/video in the Last Year:   . Barista in the Last Year:   Transportation Needs:   . Freight forwarder (Medical):   Marland Kitchen Lack of Transportation (Non-Medical):   Physical Activity:   . Days of Exercise per Week:   . Minutes of Exercise per Session:   Stress:   . Feeling of Stress :   Social Connections:   . Frequency of  Communication with Friends and Family:   . Frequency of Social Gatherings with Friends and Family:   . Attends Religious Services:   . Active Member of Clubs or Organizations:   . Attends Banker Meetings:   Marland Kitchen Marital Status:   Intimate Partner Violence:   . Fear of Current or Ex-Partner:   . Emotionally Abused:   Marland Kitchen Physically Abused:   . Sexually Abused:     Past Surgical History:  Procedure Laterality Date  . APPENDECTOMY    . BILATERAL CARPAL TUNNEL RELEASE    . TOTAL KNEE ARTHROPLASTY Right 11/23/2018   Procedure: TOTAL KNEE ARTHROPLASTY-RIGHT INTERPRETER APPOINTMENT;  Surgeon: Christena Flake, MD;  Location: [facility] ORS;  Service: Orthopedics;  Laterality: Right;  . TOTAL KNEE ARTHROPLASTY Left 06/19/2019   Procedure: TOTAL KNEE ARTHROPLASTY;  Surgeon: Christena Flake, MD;  Location: [facility] ORS;  Service: Orthopedics;  Laterality: Left;  . TUBAL LIGATION      Family History  Problem Relation Age of Onset  . Cancer Father     No Known Allergies     Assessment & Plan:   1. Varicose veins of bilateral lower extremities with pain Recommend:  The patient has had successful ablation of the previously incompetent saphenous venous system but still has persistent symptoms of pain and swelling that are having a negative impact on daily life and daily activities.  Due to the tortuosity it would not be amenable to endovenous laser ablation, therefore the patient should undergo injection foam sclerotherapy to treat the residual varicosities.  The risks, benefits and alternative therapies were reviewed in detail with the patient.  All questions were answered.  The patient agrees to proceed with foam sclerotherapy at their convenience.  The patient will continue wearing the graduated compression stockings and using the over-the-counter pain medications to treat her symptoms.       2. Primary osteoarthritis of left knee Continue NSAID medications as already ordered, these  medications have been reviewed and there are no changes at this time.  Continued activity and therapy was stressed.    Current Outpatient Medications on File Prior to Visit  Medication Sig Dispense Refill  . aspirin 81 MG EC tablet Take by mouth.    Marland Kitchen atorvastatin (LIPITOR) 20 MG tablet Take 20 mg by mouth at bedtime.     . hydrochlorothiazide (MICROZIDE) 12.5 MG capsule Take 12.5 mg by mouth daily with lunch.    . metFORMIN (GLUCOPHAGE) 850 MG tablet Take by mouth.    . triamcinolone cream (KENALOG) 0.5 % Apply topically.    . enoxaparin (LOVENOX) 40 MG/0.4ML injection Inject 0.4 mLs (40 mg total) into the skin daily. (  Patient not taking: Reported on 06/02/2020) 5.6 mL 0  . meloxicam (MOBIC) 15 MG tablet Take 15 mg by mouth daily as needed for pain.  (Patient not taking: Reported on 06/02/2020)    . Multiple Vitamin (MULTIVITAMIN WITH MINERALS) TABS tablet Take 1 tablet by mouth daily. (Patient not taking: Reported on 06/02/2020)    . oxyCODONE (OXY IR/ROXICODONE) 5 MG immediate release tablet Take 1-2 tablets (5-10 mg total) by mouth every 4 (four) hours as needed for moderate pain. (Patient not taking: Reported on 06/02/2020) 60 tablet 0  . traMADol (ULTRAM) 50 MG tablet Take 1 tablet (50 mg total) by mouth every 6 (six) hours as needed for moderate pain. (Patient not taking: Reported on 06/02/2020) 40 tablet 0   No current facility-administered medications on file prior to visit.    There are no Patient Instructions on file for this visit. No follow-ups on file.   Georgiana Spinner, NP

## 2020-06-25 ENCOUNTER — Other Ambulatory Visit: Payer: Self-pay | Admitting: Family Medicine

## 2020-06-25 DIAGNOSIS — Z1231 Encounter for screening mammogram for malignant neoplasm of breast: Secondary | ICD-10-CM

## 2020-06-26 ENCOUNTER — Other Ambulatory Visit: Payer: Self-pay | Admitting: Physician Assistant

## 2020-06-26 DIAGNOSIS — Z96652 Presence of left artificial knee joint: Secondary | ICD-10-CM

## 2020-07-01 ENCOUNTER — Ambulatory Visit (INDEPENDENT_AMBULATORY_CARE_PROVIDER_SITE_OTHER): Payer: Medicare Other | Admitting: Vascular Surgery

## 2020-07-29 ENCOUNTER — Ambulatory Visit (INDEPENDENT_AMBULATORY_CARE_PROVIDER_SITE_OTHER): Payer: Medicare Other | Admitting: Vascular Surgery

## 2020-08-26 ENCOUNTER — Ambulatory Visit (INDEPENDENT_AMBULATORY_CARE_PROVIDER_SITE_OTHER): Payer: Medicare Other | Admitting: Vascular Surgery

## 2021-02-26 ENCOUNTER — Other Ambulatory Visit: Payer: Self-pay

## 2021-02-26 ENCOUNTER — Ambulatory Visit
Admission: RE | Admit: 2021-02-26 | Discharge: 2021-02-26 | Disposition: A | Discharge status: Home / Self Care | Payer: Medicare Other | Source: Ambulatory Visit | Attending: Family Medicine | Admitting: Family Medicine

## 2021-02-26 DIAGNOSIS — Z1231 Encounter for screening mammogram for malignant neoplasm of breast: Secondary | ICD-10-CM | POA: Insufficient documentation

## 2021-07-24 ENCOUNTER — Emergency Department
Admission: EM | Admit: 2021-07-24 | Discharge: 2021-07-24 | Disposition: A | Discharge status: Home / Self Care | Payer: Medicare Other | Attending: Emergency Medicine | Admitting: Emergency Medicine

## 2021-07-24 ENCOUNTER — Emergency Department: Payer: Medicare Other

## 2021-07-24 ENCOUNTER — Other Ambulatory Visit: Payer: Self-pay

## 2021-07-24 DIAGNOSIS — I10 Essential (primary) hypertension: Secondary | ICD-10-CM | POA: Insufficient documentation

## 2021-07-24 DIAGNOSIS — N9489 Other specified conditions associated with female genital organs and menstrual cycle: Secondary | ICD-10-CM | POA: Diagnosis not present

## 2021-07-24 DIAGNOSIS — Z79899 Other long term (current) drug therapy: Secondary | ICD-10-CM | POA: Diagnosis not present

## 2021-07-24 DIAGNOSIS — N858 Other specified noninflammatory disorders of uterus: Secondary | ICD-10-CM | POA: Insufficient documentation

## 2021-07-24 DIAGNOSIS — Z7982 Long term (current) use of aspirin: Secondary | ICD-10-CM | POA: Diagnosis not present

## 2021-07-24 DIAGNOSIS — N939 Abnormal uterine and vaginal bleeding, unspecified: Secondary | ICD-10-CM | POA: Diagnosis present

## 2021-07-24 DIAGNOSIS — Z96653 Presence of artificial knee joint, bilateral: Secondary | ICD-10-CM | POA: Diagnosis not present

## 2021-07-24 LAB — TYPE AND SCREEN
ABO/RH(D): A POS
Antibody Screen: NEGATIVE

## 2021-07-24 LAB — PROTIME-INR
INR: 1 (ref 0.8–1.2)
Prothrombin Time: 13.3 seconds (ref 11.4–15.2)

## 2021-07-24 LAB — BASIC METABOLIC PANEL
Anion gap: 8 (ref 5–15)
BUN: 11 mg/dL (ref 8–23)
CO2: 30 mmol/L (ref 22–32)
Calcium: 9.3 mg/dL (ref 8.9–10.3)
Chloride: 101 mmol/L (ref 98–111)
Creatinine, Ser: 0.66 mg/dL (ref 0.44–1.00)
GFR, Estimated: >60 mL/min (ref >60)
Glucose, Bld: 124 mg/dL — ABNORMAL HIGH (ref 70–99)
Potassium: 4 mmol/L (ref 3.5–5.1)
Sodium: 139 mmol/L (ref 135–145)

## 2021-07-24 LAB — APTT: aPTT: 30 seconds (ref 24–36)

## 2021-07-24 LAB — CBC
HCT: 44.8 % (ref 36.0–46.0)
Hemoglobin: 14.7 g/dL (ref 12.0–15.0)
MCH: 27.5 pg (ref 26.0–34.0)
MCHC: 32.8 g/dL (ref 30.0–36.0)
MCV: 83.9 fL (ref 80.0–100.0)
Platelets: 261 10*3/uL (ref 150–400)
RBC: 5.34 MIL/uL — ABNORMAL HIGH (ref 3.87–5.11)
RDW: 15.7 % — ABNORMAL HIGH (ref 11.5–15.5)
WBC: 7.5 10*3/uL (ref 4.0–10.5)
nRBC: 0 % (ref 0.0–0.2)

## 2021-07-24 NOTE — ED Triage Notes (Addendum)
Pt comes pov with vaginal bleeding when using bathroom this morning. States large amount of bleeding with some clots. States this happened once before about 4 months ago but was way lighter than today.

## 2021-07-24 NOTE — ED Provider Notes (Signed)
[facility] Emergency Department Provider Note   ____________________________________________    I have reviewed the triage vital signs and the nursing notes.   HISTORY  Chief Complaint Vaginal Bleeding   Interpreter used  HPI Rose Roberts is a 70 y.o. female who presents with complaints of vaginal bleeding.  Patient reports vaginal bleeding this morning while sitting on the toilet, she notes passage of some blood clots as well.  She reports a feeling of heaviness in her pelvis over the last 2 days.  Otherwise no pain.  No nausea or vomiting.  No weight loss.  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   Knee joint replaced by other means 11/24/2018   Status post total knee replacement using cement, right 11/23/2018   Varicose veins of bilateral lower extremities with pain 09/30/2016   Chronic venous insufficiency 09/30/2016   Pain in limb 09/30/2016    Past Surgical History:  Procedure Laterality Date   APPENDECTOMY     BILATERAL CARPAL TUNNEL RELEASE     TOTAL KNEE ARTHROPLASTY Right 11/23/2018   Procedure: TOTAL KNEE ARTHROPLASTY-RIGHT INTERPRETER APPOINTMENT;  Surgeon: Christena Flake, MD;  Location: [facility] ORS;  Service: Orthopedics;  Laterality: Right;   TOTAL KNEE ARTHROPLASTY Left 06/19/2019   Procedure: TOTAL KNEE ARTHROPLASTY;  Surgeon: Christena Flake, MD;  Location: [facility] ORS;  Service: Orthopedics;  Laterality: Left;   TUBAL LIGATION      Prior to Admission medications   Medication Sig Start Date End Date Taking? Authorizing Provider  aspirin 81 MG EC tablet Take by mouth.    [provider]  atorvastatin (LIPITOR) 20 MG tablet Take 20 mg by mouth at bedtime.  02/28/17   [provider]  enoxaparin (LOVENOX) 40 MG/0.4ML injection Inject 0.4 mLs (40 mg total) into the skin daily. Patient not taking: Reported on 06/02/2020 06/21/19   Anson Oregon, PA-C  hydrochlorothiazide (MICROZIDE) 12.5 MG capsule Take 12.5 mg by mouth daily with lunch.    [provider]  meloxicam (MOBIC) 15 MG tablet Take 15 mg by mouth daily as needed for pain.  Patient not taking: Reported on 06/02/2020 08/10/16   [provider]  metFORMIN (GLUCOPHAGE) 850 MG tablet Take by mouth. 11/19/19   [provider]  Multiple Vitamin (MULTIVITAMIN WITH MINERALS) TABS tablet Take 1 tablet by mouth daily. Patient not taking: Reported on 06/02/2020    [provider]  oxyCODONE (OXY IR/ROXICODONE) 5 MG immediate release tablet Take 1-2 tablets (5-10 mg total) by mouth every 4 (four) hours as needed for moderate pain. Patient not taking: Reported on 06/02/2020 06/21/19   Anson Oregon, PA-C  traMADol (ULTRAM) 50 MG tablet Take 1 tablet (50 mg total) by mouth every 6 (six) hours as needed for moderate pain. Patient not taking: Reported on 06/02/2020 06/21/19   Anson Oregon, PA-C  triamcinolone cream (KENALOG) 0.5 % Apply topically. 04/23/20   [provider]     Allergies Patient has no known allergies.  Family History  Problem Relation Age of Onset   Cancer Father  Social History Social History   Tobacco Use   Smoking status: Never   Smokeless tobacco: Never  Vaping Use   Vaping Use: Never used  Substance Use Topics   Alcohol use: No   Drug use: No    Review of Systems  Constitutional: No fever/chills Eyes: No visual changes.  ENT: No sore throat. Cardiovascular: Denies chest pain. Respiratory: Denies shortness of breath. Gastrointestinal:   No nausea, no vomiting.   Genitourinary: As above Musculoskeletal: Negative for  back pain. Skin: Negative for rash. Neurological: Negative for headaches or weakness   ____________________________________________   PHYSICAL EXAM:  VITAL SIGNS: ED Triage Vitals [07/24/21 1145]  Enc Vitals Group     BP (!) 137/108     Pulse Rate 87     Resp 18     Temp 98.1 F (36.7 C)     Temp Source Oral     SpO2 98 %     Weight 81.6 kg (180 lb)     Height 1.676 m (5\' 6" )     Head Circumference      Peak Flow      Pain Score 0     Pain Loc      Pain Edu?      Excl. in GC?     Constitutional: Alert and oriented. No acute distress. Pleasant and interactive Eyes: Conjunctivae are normal.  Head: Atraumatic.  Cardiovascular: Normal rate, regular rhythm. Grossly normal heart sounds.  Good peripheral circulation. Respiratory: Normal respiratory effort.  No retractions. Lungs CTAB. Gastrointestinal: Soft and nontender. No distention.  No CVA tenderness.  Reassuring exam Genitourinary: Vaginal bleeding as patient noted, mild to moderate Musculoskeletal: Warm and well perfused Neurologic:  Normal speech and language. No gross focal neurologic deficits are appreciated.  Skin:  Skin is warm, dry and intact. No rash noted. Psychiatric: Mood and affect are normal. Speech and behavior are normal.  ____________________________________________   LABS (all labs ordered are listed, but only abnormal results are displayed)  Labs Reviewed  CBC - Abnormal; Notable for the following components:      Result Value   RBC 5.34 (*)    RDW 15.7 (*)    All other components within normal limits  BASIC METABOLIC PANEL - Abnormal; Notable for the following components:   Glucose, Bld 124 (*)    All other components within normal limits  APTT  PROTIME-INR  TYPE AND SCREEN   ____________________________________________  EKG  None ____________________________________________  RADIOLOGY  Ultrasound  pelvis ____________________________________________   PROCEDURES  Procedure(s) performed: No  Procedures   Critical Care performed: No ____________________________________________   INITIAL IMPRESSION / ASSESSMENT AND PLAN / ED COURSE  Pertinent labs & imaging results that were available during my care of the patient were reviewed by me and considered in my medical decision making (see chart for details).   Patient presents with postmenopausal vaginal bleeding as detailed above, vital signs are overall reassuring.  Will obtain ultrasound, labs  Lab work is overall reassuring, hemoglobin is 14.7  Ultrasound demonstrates 4 cm mass concerning for endometrial carcinoma  Discussed with Dr. of OB/GYN, he will see the patient in his office.  Patient reports bleeding has resolved, informed her to return if worsening bleeding    ____________________________________________   FINAL CLINICAL IMPRESSION(S) / ED DIAGNOSES  Final diagnoses:  Endometrial mass        Note:  This document was prepared using Dragon voice recognition software and may include unintentional dictation errors.  Jene Every, MD 07/24/21 1451

## 2021-07-24 NOTE — ED Triage Notes (Signed)
First Nurse Note:  Arrives today stating today having vaginal bleeding.

## 2021-08-17 ENCOUNTER — Encounter: Payer: Self-pay | Admitting: Obstetrics and Gynecology

## 2021-08-17 ENCOUNTER — Ambulatory Visit (INDEPENDENT_AMBULATORY_CARE_PROVIDER_SITE_OTHER): Payer: Medicare Other | Admitting: Obstetrics and Gynecology

## 2021-08-17 ENCOUNTER — Other Ambulatory Visit: Payer: Self-pay

## 2021-08-17 ENCOUNTER — Other Ambulatory Visit (HOSPITAL_COMMUNITY)
Admission: RE | Admit: 2021-08-17 | Discharge: 2021-08-17 | Disposition: A | Discharge status: Home / Self Care | Payer: Medicare Other | Source: Ambulatory Visit | Attending: Obstetrics and Gynecology | Admitting: Obstetrics and Gynecology

## 2021-08-17 VITALS — BP 134/88 | Wt 207.0 lb

## 2021-08-17 DIAGNOSIS — N858 Other specified noninflammatory disorders of uterus: Secondary | ICD-10-CM

## 2021-08-17 DIAGNOSIS — N950 Postmenopausal bleeding: Secondary | ICD-10-CM

## 2021-08-17 NOTE — Progress Notes (Signed)
Obstetrics & Gynecology Office Visit    Chief Complaint  Patient presents with   Follow-up    ER Follow up for PMB  Uterine mass noted on pelvic ultrasound  History of Present Illness: 70 y.o. [id number] female who presents in follow up from an ER visit for vaginal bleeding. SHe was noted to have a uterine mass on ultrasound. Her blood counts were normal.  She went to the ER due to heavy bleeding. She was noted to have a big mass in the uterus.  She went through menopause at age 14 years. She had an episode of bleeding in April and this was just a little.  This most recent episode was much heavier.  She had bleeding for 3 days. She has lost weight. She has anorexia for that she has had little appetite. She believes this is because she was told that she had cancer.  She denies constipation.  She has had some bloating.  She believes she has had early satiety.    Ultrasound report 07/24/2021: Uterus   Measurements: 6.4 x 4.0 x 4.6 cm = volume: 62 mL. No fibroids or other mass visualized.   Endometrium   Thickness: 28.4 mm There is a solid-appearing, heterogeneous echogenic mass within the endometrial cavity with increased blood flow measuring 4.1 x 3.0 x 2.6 cm. A small volume of fluid is noted within the lower uterine segment.   Right ovary   Not visualized.   Left ovary   Not visualized.   Other findings   No abnormal free fluid.   IMPRESSION: 1. There is a solid-appearing, heterogeneous echogenic mass within the endometrial cavity measuring up to 4.1 cm. In the setting of postmenopausal bleeding this is concerning for endometrial carcinoma. Further evaluation with tissue sampling is recommended. 2. Small volume of fluid noted within the lower uterine segment.   Past Medical History:  Diagnosis Date   GERD (gastroesophageal reflux disease)    Hyperlipidemia    Hypertension    Osteoarthritis of right knee     Past Surgical History:  Procedure Laterality Date    APPENDECTOMY     BILATERAL CARPAL TUNNEL RELEASE     TOTAL KNEE ARTHROPLASTY Right 11/23/2018   Procedure: TOTAL KNEE ARTHROPLASTY-RIGHT INTERPRETER APPOINTMENT;  Surgeon: Christena Flake, MD;  Location: [facility] ORS;  Service: Orthopedics;  Laterality: Right;   TOTAL KNEE ARTHROPLASTY Left 06/19/2019   Procedure: TOTAL KNEE ARTHROPLASTY;  Surgeon: Christena Flake, MD;  Location: [facility] ORS;  Service: Orthopedics;  Laterality: Left;   TUBAL LIGATION      Gynecologic History: No LMP recorded. Patient is postmenopausal.  Obstetric History: [id number]  Family History  Problem Relation Age of Onset   Cancer Father    Ovarian cancer Cousin    Breast cancer Cousin    Uterine cancer Cousin     Social History   Socioeconomic History   Marital status: Divorced    Spouse name: Not on file   Number of children: Not on file   Years of education: Not on file   Highest education level: Not on file  Occupational History   Not on file  Tobacco Use   Smoking status: Never   Smokeless tobacco: Never  Vaping Use   Vaping Use: Never used  Substance and Sexual Activity   Alcohol use: No   Drug use: No   Sexual activity: Not Currently    Birth control/protection: Post-menopausal  Other Topics Concern   Not on file  Social History Narrative  Not on file   Social Determinants of Health   Financial Resource Strain: Not on file  Food Insecurity: Not on file  Transportation Needs: Not on file  Physical Activity: Not on file  Stress: Not on file  Social Connections: Not on file  Intimate Partner Violence: Not on file    No Known Allergies  Prior to Admission medications   Medication Sig Start Date End Date Taking? Authorizing Provider  atorvastatin (LIPITOR) 20 MG tablet Take 20 mg by mouth at bedtime.  02/28/17   [provider]  hydrochlorothiazide (MICROZIDE) 12.5 MG capsule Take 12.5 mg by mouth daily with lunch.    [provider]    Review of Systems  Constitutional:  Negative.   HENT: Negative.    Eyes: Negative.   Respiratory: Negative.    Cardiovascular: Negative.   Gastrointestinal: Negative.   Genitourinary: Negative.        See HPI  Musculoskeletal: Negative.   Skin: Negative.   Neurological: Negative.   Psychiatric/Behavioral: Negative.      Physical Exam BP 134/88   Wt 207 lb (93.9 kg)   BMI 33.41 kg/m  No LMP recorded. Patient is postmenopausal. Physical Exam Constitutional:      General: She is not in acute distress.    Appearance: Normal appearance. She is well-developed.  Genitourinary:     Vulva, bladder and urethral meatus normal.     No lesions in the vagina.     Right Labia: No rash, tenderness, lesions, skin changes or Bartholin's cyst.    Left Labia: No tenderness, lesions, skin changes, Bartholin's cyst or rash.    No inguinal adenopathy present in the right or left side.    Pelvic Tanner Score: 5/5.    No vaginal tenderness, bleeding, ulceration or granulation tissue.      Right Adnexa: not tender, not full and no mass present.    Left Adnexa: not tender, not full and no mass present.    No cervical motion tenderness, friability, lesion or polyp.     Uterus is enlarged.     Uterus is not fixed or tender.     Uterus is anteverted.     No urethral tenderness or mass present.     Pelvic exam was performed with patient in the lithotomy position.  HENT:     Head: Normocephalic and atraumatic.  Eyes:     General: No scleral icterus.    Conjunctiva/sclera: Conjunctivae normal.  Cardiovascular:     Rate and Rhythm: Normal rate and regular rhythm.     Heart sounds: No murmur heard.   No friction rub. No gallop.  Pulmonary:     Effort: Pulmonary effort is normal. No respiratory distress.     Breath sounds: Normal breath sounds. No wheezing or rales.  Abdominal:     General: Bowel sounds are normal. There is no distension.     Palpations: Abdomen is soft. There is no mass.     Tenderness: There is no abdominal  tenderness. There is no guarding or rebound.     Hernia: There is no hernia in the left inguinal area or right inguinal area.  Musculoskeletal:        General: Normal range of motion.     Cervical back: Normal range of motion and neck supple.  Lymphadenopathy:     Lower Body: No right inguinal adenopathy. No left inguinal adenopathy.  Neurological:     General: No focal deficit present.     Mental Status:  She is alert and oriented to person, place, and time.     Cranial Nerves: No cranial nerve deficit.  Skin:    General: Skin is warm and dry.     Findings: No erythema.  Psychiatric:        Mood and Affect: Mood normal.        Behavior: Behavior normal.        Judgment: Judgment normal.   Endometrial Biopsy After discussion with the patient regarding her abnormal uterine bleeding I recommended that she proceed with an endometrial biopsy for further diagnosis. The risks, benefits, alternatives, and indications for an endometrial biopsy were discussed with the patient in detail. She understood the risks including infection, bleeding, cervical laceration and uterine perforation.  Verbal consent was obtained.   PROCEDURE NOTE:  Pipelle endometrial biopsy was performed using aseptic technique with iodine preparation.  The uterus was sounded to a length of 8 cm.  Adequate sampling was obtained with minimal blood loss.  The patient tolerated the procedure well.  Disposition will be pending pathology.  Female chaperone present for pelvic and breast  portions of the physical exam  Assessment: 70 y.o. [id number] female here for  1. Postmenopausal bleeding   2. Uterine mass      Plan: Problem List Items Addressed This Visit   None Visit Diagnoses     Postmenopausal bleeding    -  Primary   Relevant Orders   Surgical pathology   Uterine mass       Relevant Orders   Surgical pathology      Will work patient up for uterine mass with postmenopausal bleeding. If negative and no indication  of type of mass, may need to go for hysteroscopy, D&C for further evaluation.  The visit was carried out with the use of a hospital-provided spanish language interpreter.    A total of 39 minutes were spent face-to-face with the patient as well as preparation, review, communication, and documentation during this encounter.    Thomasene Mohair, MD 08/17/2021 12:33 PM

## 2021-08-18 LAB — SURGICAL PATHOLOGY

## 2021-08-24 ENCOUNTER — Telehealth: Payer: Self-pay

## 2021-08-24 NOTE — Telephone Encounter (Signed)
Pt's granddaughter calling; pt would like bx results.  [phone] (granddaughter and daughter are on [place])

## 2021-08-28 ENCOUNTER — Telehealth: Payer: Self-pay | Admitting: Obstetrics and Gynecology

## 2021-08-28 NOTE — Telephone Encounter (Signed)
Pt is needing to get results from the procedure that was done on 10/31.   Cb# for [place] Noe [phone]. (She is on pts DPR to get information)

## 2021-09-03 NOTE — Telephone Encounter (Signed)
FYI, I spoke to patient's daughter, who is on the [facility] and discussed the findings. She will follow up with you for further follow up.  This is the patient I spoke to you about who has an endometrial mass. The pipelle biopsy just showed necrotic tissue. Gyn Onc says they need a diagnosis.  She has an appt with you on 12/8.  I didn't know if that was soon enough for you or not.

## 2021-09-18 ENCOUNTER — Telehealth: Payer: Self-pay

## 2021-09-18 ENCOUNTER — Other Ambulatory Visit: Payer: Self-pay

## 2021-09-18 ENCOUNTER — Encounter: Payer: Self-pay | Admitting: Obstetrics and Gynecology

## 2021-09-18 ENCOUNTER — Ambulatory Visit (INDEPENDENT_AMBULATORY_CARE_PROVIDER_SITE_OTHER): Payer: Medicare Other | Admitting: Obstetrics and Gynecology

## 2021-09-18 VITALS — BP 122/72 | Ht 65.0 in | Wt 207.2 lb

## 2021-09-18 DIAGNOSIS — N9489 Other specified conditions associated with female genital organs and menstrual cycle: Secondary | ICD-10-CM

## 2021-09-18 NOTE — Telephone Encounter (Signed)
F2F with patient to schedule Hysteroscopy D&C w Jerene Pitch  DOS 12/6  H&P today  Pre-admit phone call 12/5 @ 8a - 1p  Advised that pt may also receive calls from the hospital pharmacy and pre-service center.  Confirmed pt has Medicare as primary insurance. Medicaid as secondary insurance.

## 2021-09-18 NOTE — Progress Notes (Signed)
Patient ID: Rose Roberts, female   DOB: Mar 18, 1951, 70 y.o.   MRN: [id number]  Reason for Consult: Follow-up   Referred by Preston Fleeting*  Subjective:     HPI:  Rose Roberts is a 70 y.o. female Patient has been having postmenopausal bleeding. Biopsy in office showed necrotic tumor.   Gynecological History  No LMP recorded. Patient is postmenopausal.  Past Medical History:  Diagnosis Date   GERD (gastroesophageal reflux disease)    Hyperlipidemia    Hypertension    Osteoarthritis of right knee    Family History  Problem Relation Age of Onset   Cancer Father    Ovarian cancer Cousin    Breast cancer Cousin    Uterine cancer Cousin    Past Surgical History:  Procedure Laterality Date   APPENDECTOMY     BILATERAL CARPAL TUNNEL RELEASE     TOTAL KNEE ARTHROPLASTY Right 11/23/2018   Procedure: TOTAL KNEE ARTHROPLASTY-RIGHT INTERPRETER APPOINTMENT;  Surgeon: Christena Flake, MD;  Location: [facility] ORS;  Service: Orthopedics;  Laterality: Right;   TOTAL KNEE ARTHROPLASTY Left 06/19/2019   Procedure: TOTAL KNEE ARTHROPLASTY;  Surgeon: Christena Flake, MD;  Location: [facility] ORS;  Service: Orthopedics;  Laterality: Left;   TUBAL LIGATION      Short Social History:  Social History   Tobacco Use   Smoking status: Never   Smokeless tobacco: Never  Substance Use Topics   Alcohol use: No    No Known Allergies  Current Outpatient Medications  Medication Sig Dispense Refill   atorvastatin (LIPITOR) 20 MG tablet Take 20 mg by mouth at bedtime.      triamcinolone cream (KENALOG) 0.5 % Apply topically.     aspirin 81 MG EC tablet Take by mouth.     enoxaparin (LOVENOX) 40 MG/0.4ML injection Inject 0.4 mLs (40 mg total) into the skin daily. (Patient not taking: Reported on 06/02/2020) 5.6 mL 0   hydrochlorothiazide (MICROZIDE) 12.5 MG capsule Take 12.5 mg by mouth daily with lunch.     meloxicam (MOBIC) 15 MG tablet Take 15 mg by mouth daily as needed for pain.  (Patient  not taking: Reported on 06/02/2020)     metFORMIN (GLUCOPHAGE) 850 MG tablet Take by mouth.     Multiple Vitamin (MULTIVITAMIN WITH MINERALS) TABS tablet Take 1 tablet by mouth daily. (Patient not taking: Reported on 06/02/2020)     oxyCODONE (OXY IR/ROXICODONE) 5 MG immediate release tablet Take 1-2 tablets (5-10 mg total) by mouth every 4 (four) hours as needed for moderate pain. (Patient not taking: Reported on 06/02/2020) 60 tablet 0   traMADol (ULTRAM) 50 MG tablet Take 1 tablet (50 mg total) by mouth every 6 (six) hours as needed for moderate pain. (Patient not taking: Reported on 06/02/2020) 40 tablet 0   No current facility-administered medications for this visit.    Review of Systems  Constitutional: Negative for chills, fatigue, fever and unexpected weight change.  HENT: Negative for trouble swallowing.  Eyes: Negative for loss of vision.  Respiratory: Negative for cough, shortness of breath and wheezing.  Cardiovascular: Negative for chest pain, leg swelling, palpitations and syncope.  GI: Negative for abdominal pain, blood in stool, diarrhea, nausea and vomiting.  GU: Negative for difficulty urinating, dysuria, frequency and hematuria.  Musculoskeletal: Negative for back pain, leg pain and joint pain.  Skin: Negative for rash.  Neurological: Negative for dizziness, headaches, light-headedness, numbness and seizures.  Psychiatric: Negative for behavioral problem, confusion, depressed mood and sleep disturbance.  Objective:  Objective   Vitals:   09/18/21 1527  BP: 122/72  Weight: 207 lb 3.2 oz (94 kg)  Height: 5\' 5"  (1.651 m)   Body mass index is 34.48 kg/m.  Physical Exam Vitals and nursing note reviewed. Exam conducted with a chaperone present.  Constitutional:      Appearance: Normal appearance. She is well-developed.  HENT:     Head: Normocephalic and atraumatic.  Eyes:     Extraocular Movements: Extraocular movements intact.     Pupils: Pupils are equal,  round, and reactive to light.  Cardiovascular:     Rate and Rhythm: Normal rate and regular rhythm.  Pulmonary:     Effort: Pulmonary effort is normal. No respiratory distress.     Breath sounds: Normal breath sounds.  Abdominal:     General: Abdomen is flat.     Palpations: Abdomen is soft.  Genitourinary:    Comments: External: Normal appearing vulva. No lesions noted.  Speculum examination: Normal appearing cervix. No blood in the vaginal vault. No discharge.  Bimanual examination: Uterus midline, non-tender, normal in size, shape and contour.  No CMT. No adnexal masses. No adnexal tenderness. Pelvis not fixed.  Musculoskeletal:        General: No signs of injury.  Skin:    General: Skin is warm and dry.  Neurological:     Mental Status: She is alert and oriented to person, place, and time.  Psychiatric:        Behavior: Behavior normal.        Thought Content: Thought content normal.        Judgment: Judgment normal.    Assessment/Plan:     70 yo with postmenopausal bleeding. Prior endometrial biopsy shows necrotic tumor. Discussed with gynecological oncology who recommended D&C for further sampling. Will plan for close follow up in the OR. Note sent to surgical scheduler.   More than 20 minutes were spent face to face with the patient in the room, reviewing the medical record, labs and images, and coordinating care for the patient. The plan of management was discussed in detail and counseling was provided.       61 MD [facility], [facility] 09/18/2021 3:59 PM

## 2021-09-18 NOTE — Telephone Encounter (Signed)
-----   Message from Natale Milch, MD sent at 09/18/2021  3:39 PM EST ----- Surgery Booking Request Patient Full Name:  COLETTA LOCKNER  MRN: [id number]  DOB: 10-19-1950  Surgeon: Natale Milch, MD  Requested Surgery Date and Time: 09/22/2021 Primary Diagnosis AND Code: postmenopausal bleeding Secondary Diagnosis and Code:  Surgical Procedure: Hysteroscopy D&C RNFA Requested?: No L&D Notification: No Admission Status: same day surgery Length of Surgery: 50 min Special Case Needs: No H&P: No Phone Interview???:  Yes Interpreter: Yes- SPANISH Medical Clearance:  No Special Scheduling Instructions: No Any known health/anesthesia issues, diabetes, sleep apnea, latex allergy, defibrillator/pacemaker?: No Acuity: P2   (P1 highest, P2 delay may cause harm, P3 low, elective gyn, P4 lowest) Post op follow up visits: 1 week post op

## 2021-09-18 NOTE — Patient Instructions (Signed)
Hysteroscopy Hysteroscopy is a procedure used to look inside a woman's womb (uterus). This may be done for various reasons, including: To look for tumors and other growths in the uterus. To evaluate abnormal bleeding, fibroid tumors, polyps, scar tissue, or uterine cancer. To determine why a woman is unable to get pregnant or has had repeated pregnancy losses. To locate an IUD (intrauterine device). To place a birth control device into the fallopian tubes. During this procedure, a thin, flexible tube with a small light and camera (hysteroscope) is used to examine the uterus. The camera sends images to a monitor in the room so that your health care provider can view the inside of your uterus. A hysteroscopy should be done right after a menstrual period. Tell a health care provider about: Any allergies you have. All medicines you are taking, including vitamins, herbs, eye drops, creams, and over-the-counter medicines. Any problems you or family members have had with anesthetic medicines. Any blood disorders you have. Any surgeries you have had. Any medical conditions you have. Whether you are pregnant or may be pregnant. Whether you have been diagnosed with an STI (sexually transmitted infection) or you think you have an STI. What are the risks? Generally, this is a safe procedure. However, problems may occur, including: Excessive bleeding. Infection. Damage to the uterus or other structures or organs. Allergic reaction to medicines or fluids that are used in the procedure. What happens before the procedure? Staying hydrated Follow instructions from your health care provider about hydration, which may include: Up to 2 hours before the procedure - you may continue to drink clear liquids, such as water, clear fruit juice, black coffee, and plain tea. Eating and drinking restrictions Follow instructions from your health care provider about eating and drinking, which may include: 8 hours  before the procedure - stop eating solid foods and drink clear liquids only. 2 hours before the procedure - stop drinking clear liquids. Medicines Ask your health care provider about: Changing or stopping your regular medicines. This is especially important if you are taking diabetes medicines or blood thinners. Taking medicines such as aspirin and ibuprofen. These medicines can thin your blood. Do not take these medicines unless your health care provider tells you to take them. Taking over-the-counter medicines, vitamins, herbs, and supplements. Medicine may be placed in your cervix the day before the procedure. This medicine causes the cervix to open (dilate). The larger opening makes it easier for the hysteroscope to be inserted into the uterus during the procedure. General instructions Ask your health care provider: What steps will be taken to help prevent infection. These steps may include: Washing skin with a germ-killing soap. Taking antibiotic medicine. Do not use any products that contain nicotine or tobacco for at least 4 weeks before the procedure. These products include cigarettes, chewing tobacco, and vaping devices, such as e-cigarettes. If you need help quitting, ask your health care provider. Plan to have a responsible adult take you home from the hospital or clinic. Plan to have a responsible adult care for you for the time you are told after you leave the hospital or clinic. This is important. Empty your bladder before the procedure begins. What happens during the procedure? An IV will be inserted into one of your veins. You may be given: A medicine to help you relax (sedative). A medicine that numbs the area around the cervix (local anesthetic). A medicine to make you fall asleep (general anesthetic). A hysteroscope will be inserted through your vagina   and into your uterus. Air or fluid will be used to enlarge your uterus to allow your health care provider to see it better.  The amount of fluid used will be carefully checked throughout the procedure. In some cases, tissue may be gently scraped from inside the uterus and sent to a lab for testing (biopsy). The procedure may vary among health care providers and hospitals. What can I expect after the procedure? Your blood pressure, heart rate, breathing rate, and blood oxygen level will be monitored until you leave the hospital or clinic. You may have cramps. You may be given medicines for this. You may have bleeding, which may vary from light spotting to menstrual-like bleeding. This is normal. If you had a biopsy, it is up to you to get the results. Ask your health care provider, or the department that is doing the procedure, when your results will be ready. Follow these instructions at home: Activity Rest as told by your health care provider. Return to your normal activities as told by your health care provider. Ask your health care provider what activities are safe for you. If you were given a sedative during the procedure, it can affect you for several hours. Do not drive or operate machinery until your health care provider says that it is safe. Medicines Do not take aspirin or other NSAIDs during recovery, as told by your healthcare provider. It can increase the risk of bleeding. Ask your health care provider if the medicine prescribed to you: Requires you to avoid driving or using machinery. Can cause constipation. You may need to take these actions to prevent or treat constipation: Drink enough fluid to keep your urine pale yellow. Take over-the-counter or prescription medicines. Eat foods that are high in fiber, such as beans, whole grains, and fresh fruits and vegetables. Limit foods that are high in fat and processed sugars, such as fried or sweet foods. General instructions Do not douche, use tampons, or have sex for 2 weeks after the procedure, or until your health care provider approves. Do not take  baths, swim, or use a hot tub until your health care provider approves. Take showers instead of baths for 2 weeks, or for as long as told by your health care provider. Keep all follow-up visits. This is important. Contact a health care provider if: You feel dizzy or lightheaded. You feel nauseous. You have abnormal vaginal discharge. You have a rash. You have pain that does not get better with medicine. You have chills. Get help right away if: You have bleeding that is heavier than a normal menstrual period. You have a fever. You have pain or cramps that get worse. You develop new abdominal pain. You faint. You have pain in your shoulder. You are short of breath. Summary Hysteroscopy is a procedure that is used to look inside a woman's womb (uterus). After the procedure, you may have bleeding, which varies from light spotting to menstrual-like bleeding. This is normal. You may also have cramps. Do not douche, use tampons, or have sex for 2 weeks after the procedure, or until your health care provider approves. Plan to have a responsible adult take you home from the hospital or clinic. This information is not intended to replace advice given to you by your health care provider. Make sure you discuss any questions you have with your health care provider. Document Revised: 06/17/2021 Document Reviewed: 05/21/2020 Elsevier Patient Education  2022 Elsevier Inc.  

## 2021-09-21 ENCOUNTER — Other Ambulatory Visit
Admission: RE | Admit: 2021-09-21 | Discharge: 2021-09-21 | Disposition: A | Discharge status: Home / Self Care | Payer: Medicare Other | Source: Ambulatory Visit | Attending: Obstetrics and Gynecology | Admitting: Obstetrics and Gynecology

## 2021-09-21 ENCOUNTER — Ambulatory Visit
Admission: RE | Admit: 2021-09-21 | Discharge: 2021-09-21 | Disposition: A | Discharge status: Home / Self Care | Payer: Medicare Other | Source: Ambulatory Visit | Attending: Obstetrics and Gynecology | Admitting: Obstetrics and Gynecology

## 2021-09-21 ENCOUNTER — Other Ambulatory Visit: Payer: Self-pay

## 2021-09-21 DIAGNOSIS — I10 Essential (primary) hypertension: Secondary | ICD-10-CM | POA: Insufficient documentation

## 2021-09-21 DIAGNOSIS — N9489 Other specified conditions associated with female genital organs and menstrual cycle: Secondary | ICD-10-CM | POA: Insufficient documentation

## 2021-09-21 DIAGNOSIS — N950 Postmenopausal bleeding: Secondary | ICD-10-CM

## 2021-09-21 DIAGNOSIS — Z01818 Encounter for other preprocedural examination: Secondary | ICD-10-CM | POA: Insufficient documentation

## 2021-09-21 LAB — CBC
HCT: 45.4 % (ref 36.0–46.0)
Hemoglobin: 14.3 g/dL (ref 12.0–15.0)
MCH: 26.3 pg (ref 26.0–34.0)
MCHC: 31.5 g/dL (ref 30.0–36.0)
MCV: 83.6 fL (ref 80.0–100.0)
Platelets: 282 10*3/uL (ref 150–400)
RBC: 5.43 MIL/uL — ABNORMAL HIGH (ref 3.87–5.11)
RDW: 15.4 % (ref 11.5–15.5)
WBC: 9.1 10*3/uL (ref 4.0–10.5)
nRBC: 0 % (ref 0.0–0.2)

## 2021-09-21 LAB — TYPE AND SCREEN
ABO/RH(D): A POS
Antibody Screen: NEGATIVE

## 2021-09-21 LAB — BASIC METABOLIC PANEL
Anion gap: 7 (ref 5–15)
BUN: 14 mg/dL (ref 8–23)
CO2: 30 mmol/L (ref 22–32)
Calcium: 9.4 mg/dL (ref 8.9–10.3)
Chloride: 103 mmol/L (ref 98–111)
Creatinine, Ser: 0.66 mg/dL (ref 0.44–1.00)
GFR, Estimated: >60 mL/min (ref >60)
Glucose, Bld: 91 mg/dL (ref 70–99)
Potassium: 3.8 mmol/L (ref 3.5–5.1)
Sodium: 140 mmol/L (ref 135–145)

## 2021-09-21 MED ORDER — POVIDONE-IODINE 10 % EX SWAB: 2.0000 "application " | Freq: Once | CUTANEOUS | Status: DC

## 2021-09-21 MED ORDER — CHLORHEXIDINE GLUCONATE 0.12 % MT SOLN: 15.0000 mL | Freq: Once | OROMUCOSAL | Status: AC

## 2021-09-21 MED ORDER — FAMOTIDINE 20 MG PO TABS: 20.0000 mg | ORAL_TABLET | Freq: Once | ORAL | Status: AC

## 2021-09-21 MED ORDER — LACTATED RINGERS IV SOLN: INTRAVENOUS | Status: DC

## 2021-09-21 MED ORDER — ORAL CARE MOUTH RINSE: 15.0000 mL | Freq: Once | OROMUCOSAL | Status: AC

## 2021-09-21 MED ORDER — IOHEXOL 300 MG/ML  SOLN
100.0000 mL | Freq: Once | INTRAMUSCULAR | Status: AC | PRN
  Administered 2021-09-21: 17:00:00 100 mL via INTRAVENOUS

## 2021-09-21 NOTE — Patient Instructions (Signed)
Your procedure is scheduled SN:[id number] September 22, 2021. Su procedimiento est programado para: Martes 6 de Diciembre del 2022. Report to Day Surgery inside [facility] [place] 2nd floor.  Presntese a: Diplomatic Services operational officer del [facility] 2ndo piso.  To find out your arrival time please call [phone] between 1PM - 3PM on Monday September 21, 2021. Para saber su hora de llegada por favor llame al [phone] entre la 1PM - 3PM el da: Lunes 5 de Diciembre del 2022.  Remember: Instructions that are not followed completely may result in serious medical risk, up to and including death,  or upon the discretion of your surgeon and anesthesiologist your surgery may need to be rescheduled.  Recuerde: Las instrucciones que no se siguen completamente Armed forces logistics/support/administrative officer en un riesgo de salud grave, incluyendo hasta  la [place] o a discrecin de su cirujano y Scientific laboratory technician, su ciruga se puede posponer.   __X_ 1.Do not eat food after midnight the night before your procedure. No    gum chewing or hard candies. You may drink clear liquids up to 2 hours     before you are scheduled to arrive for your surgery- DO not drink clear     Liquids within 2 hours of the start of your surgery.     Clear Liquids include:    water, apple juice without pulp, clear carbohydrate drink such as    Clearfast of [place], Black Coffee or Tea (Do not add anything to coffee or tea).      No coma nada despus de la medianoche de la noche anterior a su    procedimiento. No coma chicles ni caramelos duros. Puede tomar    lquidos claros hasta 2 horas antes de su hora programada de llegada al     hospital para su procedimiento. No tome lquidos claros durante el     transcurso de las 2 horas de su llegada programada al hospital para su     procedimiento, ya que esto puede llevar a que su procedimiento se    retrase o tenga que volver a Magazine features editor.  Los lquidos claros incluyen:          - Agua o jugo de [place] sin  pulpa          - Bebidas claras con carbohidratos como ClearFast o Gatorade          - Caf negro o t claro (sin leche, sin cremas, no agregue nada al caf ni al t)  No tome nada que no est en esta lista.  Los pacientes con diabetes tipo 1 y tipo 2 solo deben Printmaker.  Llame a la clnica de [facility] o a la unidad de Same Day Surgery si  tiene alguna pregunta sobre estas instrucciones.              _X__ 2.Do Not Smoke or use e-cigarettes For 24 Hours Prior to Your Surgery.    Do not use any chewable tobacco products for at least 6   hours prior to surgery.    No fume ni use cigarrillos electrnicos durante las 24 horas previas    a su [place].  No use ningn producto de tabaco masticable durante   al menos 6 horas antes de la [place].     __X_ 3. No alcohol for 24 hours before or after surgery.    No tome alcohol durante las 24 horas antes ni despus de la [place].   __X__4. On the morning of surgery brush your  teeth with toothpaste and water, you                may rinse your mouth with mouthwash if you wish.  Do not swallow any toothpaste of mouthwash.   En la maana de la [place], cepllese los dientes con pasta de dientes y [place],                [place] enjuagarse la boca con enjuague bucal si lo desea. No ingiera ninguna pasta de dientes o enjuague bucal.   __X__ 5. Notify your doctor if there is any change in your medical condition (cold,fever, infections).    Informe a su mdico si hay algn cambio en su condicin mdica  (resfriado, fiebre, infecciones).   Do not wear jewelry, make-up, hairpins, clips or nail polish.  No use joyas, maquillajes, pinzas/ganchos para el cabello ni esmalte de uas.  Do not wear lotions, powders, or perfumes. You may wear deodorant.  No use lociones, polvos o perfumes.  Puede usar desodorante.    Do not shave 48 hours prior to surgery. Men may shave face and neck.  No se afeite 48 horas antes de la [place].  Los hombres pueden [employer] cara   y el cuello.   Do not bring valuables to the hospital.   No lleve objetos de valor al hospital.  [facility] is not responsible for any belongings or valuables.  [place] no se hace responsable de ningn tipo de pertenencias u objetos de Licensed conveyancer.               Contacts, dentures or bridgework may not be worn into surgery.  Los lentes de [place], las dentaduras postizas o puentes no se pueden usar en la [place].   Leave your suitcase in the car. After surgery it may be brought to your room.  Deje su maleta en el auto.  Despus de la ciruga podr traerla a su habitacin.   For patients admitted to the hospital, discharge time is determined by your  treatment team.  Para los pacientes que sean ingresados al hospital, el tiempo en el cual se le  dar de alta es determinado por su equipo de [place].   Patients discharged the day of surgery will not be allowed to drive home. A los pacientes que se les da de alta el mismo da de la ciruga no se les permitir conducir a Higher education careers adviser.  __X__ Take these medicines the morning of surgery with A SIP OF WATER:          [employer] estas medicinas la maana de la ciruga con UN SORBO DE AGUA:  1. None   2.   3.   4.       5.  6.  ____ Fleet Enema (as directed)          Enema de Fleet (segn lo indicado)    __X__ Use CHG Soap as directed          Utilice el jabn de CHG segn lo indicado  ____ Use inhalers on the day of surgery          Use los inhaladores el da de la ciruga  ____ Stop metformin 2 days prior to surgery          Deje de tomar el metformin 2 das antes de la ciruga    ____ Take 1/2 of usual insulin dose the night before surgery and none on the morning of surgery  Tome la mitad de la dosis habitual de insulina la noche antes de la [place] y no tome nada en la maana de la             [place]  ____ Stop Coumadin/Plavix/aspirin on           Deje de tomar el Coumadin/Plavix/aspirina el da:  __X__ Stop  Anti-inflammatories such as Ibuprofen, Aleve, Advil, naproxen, Meloxicam, Ketoralc and Midol, aspirin, Goody's or BC powders. OK to take Tylenol.          Deje de tomar antiinflamatorios como  Ibuprofen, Aleve, Advil, naproxen, Meloxicam, Ketoralc and Midol, aspirinas, Goody's or BC powders. OK to take Tylenol.   __X__ Do not start any vitamins and or herbal supplements before your surgery.           No empiece a tomar vitaminas o suplementos antes de la [place].  ____ Bring C-Pap to the hospital          Lleve el C-Pap al hospital

## 2021-09-21 NOTE — Telephone Encounter (Signed)
Orders placed- thank you for all your work with this

## 2021-09-22 ENCOUNTER — Encounter: Payer: Self-pay | Admitting: Obstetrics and Gynecology

## 2021-09-22 ENCOUNTER — Ambulatory Visit: Payer: Medicare Other | Admitting: Urgent Care

## 2021-09-22 ENCOUNTER — Ambulatory Visit
Admission: RE | Admit: 2021-09-22 | Discharge: 2021-09-22 | Disposition: A | Discharge status: Home / Self Care | Payer: Medicare Other | Attending: Obstetrics and Gynecology | Admitting: Obstetrics and Gynecology

## 2021-09-22 ENCOUNTER — Ambulatory Visit: Payer: Medicare Other | Admitting: Anesthesiology

## 2021-09-22 ENCOUNTER — Other Ambulatory Visit: Payer: Self-pay

## 2021-09-22 ENCOUNTER — Encounter
Admission: RE | Disposition: A | Discharge status: Home / Self Care | Payer: Self-pay | Source: Home / Self Care | Attending: Obstetrics and Gynecology

## 2021-09-22 DIAGNOSIS — N840 Polyp of corpus uteri: Secondary | ICD-10-CM

## 2021-09-22 DIAGNOSIS — N950 Postmenopausal bleeding: Secondary | ICD-10-CM | POA: Diagnosis present

## 2021-09-22 HISTORY — PX: HYSTEROSCOPY WITH D & C: SHX1775

## 2021-09-22 LAB — GLUCOSE, CAPILLARY
Glucose-Capillary: 187 mg/dL — ABNORMAL HIGH (ref 70–99)
Glucose-Capillary: 85 mg/dL (ref 70–99)

## 2021-09-22 SURGERY — DILATATION AND CURETTAGE /HYSTEROSCOPY
Anesthesia: General | Site: Uterus

## 2021-09-22 MED ORDER — FENTANYL CITRATE (PF) 100 MCG/2ML IJ SOLN: 25.0000 ug | INTRAMUSCULAR | Status: DC | PRN

## 2021-09-22 MED ORDER — PROPOFOL 10 MG/ML IV BOLUS
INTRAVENOUS | Status: AC
  Filled 2021-09-22: qty 20

## 2021-09-22 MED ORDER — GLYCOPYRROLATE 0.2 MG/ML IJ SOLN
INTRAMUSCULAR | Status: AC
  Filled 2021-09-22: qty 1

## 2021-09-22 MED ORDER — FENTANYL CITRATE (PF) 100 MCG/2ML IJ SOLN
INTRAMUSCULAR | Status: DC | PRN
  Administered 2021-09-22 (×2): 50 ug via INTRAVENOUS

## 2021-09-22 MED ORDER — DEXAMETHASONE SODIUM PHOSPHATE 10 MG/ML IJ SOLN
INTRAMUSCULAR | Status: DC | PRN
Start: 2021-09-22 — End: 2021-09-22
  Administered 2021-09-22: 10 mg via INTRAVENOUS

## 2021-09-22 MED ORDER — ACETAMINOPHEN 10 MG/ML IV SOLN
INTRAVENOUS | Status: AC
  Filled 2021-09-22: qty 100

## 2021-09-22 MED ORDER — MIDAZOLAM HCL 2 MG/2ML IJ SOLN
INTRAMUSCULAR | Status: AC
  Filled 2021-09-22: qty 2

## 2021-09-22 MED ORDER — CHLORHEXIDINE GLUCONATE 0.12 % MT SOLN
OROMUCOSAL | Status: AC
  Administered 2021-09-22: 15 mL via OROMUCOSAL
  Filled 2021-09-22: qty 15

## 2021-09-22 MED ORDER — LIDOCAINE HCL (PF) 2 % IJ SOLN
INTRAMUSCULAR | Status: AC
  Filled 2021-09-22: qty 5

## 2021-09-22 MED ORDER — ONDANSETRON HCL 4 MG/2ML IJ SOLN
INTRAMUSCULAR | Status: DC | PRN
  Administered 2021-09-22: 4 mg via INTRAVENOUS

## 2021-09-22 MED ORDER — FAMOTIDINE 20 MG PO TABS
ORAL_TABLET | ORAL | Status: AC
  Administered 2021-09-22: 20 mg via ORAL
  Filled 2021-09-22: qty 1

## 2021-09-22 MED ORDER — FENTANYL CITRATE (PF) 100 MCG/2ML IJ SOLN
INTRAMUSCULAR | Status: AC
  Filled 2021-09-22: qty 2

## 2021-09-22 MED ORDER — DEXAMETHASONE SODIUM PHOSPHATE 10 MG/ML IJ SOLN
INTRAMUSCULAR | Status: AC
  Filled 2021-09-22: qty 1

## 2021-09-22 MED ORDER — LIDOCAINE HCL (CARDIAC) PF 100 MG/5ML IV SOSY
PREFILLED_SYRINGE | INTRAVENOUS | Status: DC | PRN
  Administered 2021-09-22: 80 mg via INTRAVENOUS

## 2021-09-22 MED ORDER — PROPOFOL 10 MG/ML IV BOLUS
INTRAVENOUS | Status: DC | PRN
  Administered 2021-09-22: 20 mg via INTRAVENOUS
  Administered 2021-09-22: 40 mg via INTRAVENOUS
  Administered 2021-09-22: 200 mg via INTRAVENOUS

## 2021-09-22 MED ORDER — PHENYLEPHRINE 40 MCG/ML (10ML) SYRINGE FOR IV PUSH (FOR BLOOD PRESSURE SUPPORT)
PREFILLED_SYRINGE | INTRAVENOUS | Status: DC | PRN
  Administered 2021-09-22: 80 ug via INTRAVENOUS

## 2021-09-22 MED ORDER — ONDANSETRON HCL 4 MG/2ML IJ SOLN
INTRAMUSCULAR | Status: AC
  Filled 2021-09-22: qty 2

## 2021-09-22 MED ORDER — PHENYLEPHRINE HCL (PRESSORS) 10 MG/ML IV SOLN
INTRAVENOUS | Status: DC | PRN
Start: 2021-09-22 — End: 2021-09-22
  Administered 2021-09-22: 160 ug via INTRAVENOUS

## 2021-09-22 MED ORDER — ACETAMINOPHEN 500 MG PO TABS: 1000.0000 mg | ORAL_TABLET | Freq: Four times a day (QID) | ORAL | 0 refills | Status: DC | PRN

## 2021-09-22 MED ORDER — ROCURONIUM BROMIDE 10 MG/ML (PF) SYRINGE
PREFILLED_SYRINGE | INTRAVENOUS | Status: AC
  Filled 2021-09-22: qty 10

## 2021-09-22 MED ORDER — DEXMEDETOMIDINE (PRECEDEX) IN NS 20 MCG/5ML (4 MCG/ML) IV SYRINGE
PREFILLED_SYRINGE | INTRAVENOUS | Status: DC | PRN
  Administered 2021-09-22 (×2): 4 ug via INTRAVENOUS

## 2021-09-22 MED ORDER — ONDANSETRON HCL 4 MG/2ML IJ SOLN: 4.0000 mg | Freq: Once | INTRAMUSCULAR | Status: DC | PRN

## 2021-09-22 MED ORDER — ACETAMINOPHEN 10 MG/ML IV SOLN
INTRAVENOUS | Status: DC | PRN
  Administered 2021-09-22: 1000 mg via INTRAVENOUS

## 2021-09-22 SURGICAL SUPPLY — 21 items
DEVICE MYOSURE LITE (MISCELLANEOUS) IMPLANT
DEVICE MYOSURE REACH (MISCELLANEOUS) IMPLANT
DRSG TELFA 3X8 NADH (GAUZE/BANDAGES/DRESSINGS) IMPLANT
ELECT REM PT RETURN 9FT ADLT (ELECTROSURGICAL)
ELECTRODE REM PT RTRN 9FT ADLT (ELECTROSURGICAL) IMPLANT
GAUZE 4X4 16PLY ~~LOC~~+RFID DBL (SPONGE) ×4 IMPLANT
GLOVE SURG SYN 6.5 ES PF (GLOVE) ×2 IMPLANT
GLOVE SURG UNDER POLY LF SZ6.5 (GLOVE) ×4 IMPLANT
GOWN STRL REUS W/ TWL LRG LVL3 (GOWN DISPOSABLE) ×2 IMPLANT
GOWN STRL REUS W/TWL LRG LVL3 (GOWN DISPOSABLE) ×2
IV NS IRRIG 3000ML ARTHROMATIC (IV SOLUTION) ×2 IMPLANT
KIT PROCEDURE FLUENT (KITS) IMPLANT
MANIFOLD NEPTUNE II (INSTRUMENTS) ×2 IMPLANT
PACK DNC HYST (MISCELLANEOUS) ×2 IMPLANT
PAD OB MATERNITY 4.3X12.25 (PERSONAL CARE ITEMS) ×2 IMPLANT
PAD PREP 24X41 OB/GYN DISP (PERSONAL CARE ITEMS) ×2 IMPLANT
SCRUB EXIDINE 4% CHG 4OZ (MISCELLANEOUS) ×2 IMPLANT
SEAL ROD LENS SCOPE MYOSURE (ABLATOR) ×2 IMPLANT
SET CYSTO W/LG BORE CLAMP LF (SET/KITS/TRAYS/PACK) IMPLANT
TOWEL OR 17X26 4PK STRL BLUE (TOWEL DISPOSABLE) ×2 IMPLANT
WATER STERILE IRR 500ML POUR (IV SOLUTION) ×2 IMPLANT

## 2021-09-22 NOTE — Anesthesia Postprocedure Evaluation (Signed)
Anesthesia Post Note  Patient: Rose Roberts  Procedure(s) Performed: DILATATION AND CURETTAGE /HYSTEROSCOPY and Polypectomy (Uterus)  Patient location during evaluation: PACU Anesthesia Type: General Level of consciousness: awake and alert Pain management: pain level controlled Vital Signs Assessment: post-procedure vital signs reviewed and stable Respiratory status: spontaneous breathing, nonlabored ventilation, respiratory function stable and patient connected to nasal cannula oxygen Cardiovascular status: blood pressure returned to baseline and stable Postop Assessment: no apparent nausea or vomiting Anesthetic complications: no Comments: Patient hypertensive to 180s/90s. Patient takes diuretic normally at home. Patient not in pain, feels tired. Otherwise stable for discharge home. Advised patient that if she feels headache, blurry vision, or chest pain, to go to emergency department.   No notable events documented.   Last Vitals:  Vitals:   09/22/21 2000 09/22/21 2002  BP:  (!) 176/88  Pulse: 73 72  Resp: 15 14  Temp: 37.1 C (!) 36.1 C  SpO2: 98%     Last Pain:  Vitals:   09/22/21 2002  TempSrc: Temporal  PainSc: 0-No pain                 Corinda Gubler

## 2021-09-22 NOTE — Op Note (Signed)
Operative Note  09/22/2021  PRE-OP DIAGNOSIS: Postmenopausal bleeding  POST-OP DIAGNOSIS: same   SURGEON: Evian Salguero MD  PROCEDURE: Procedure(s): DILATATION AND CURETTAGE /HYSTEROSCOPY and Polypectomy   ANESTHESIA: Choice   ESTIMATED BLOOD LOSS: 2 cc   SPECIMENS:  Endometrial polyp and curretings  FLUID DEFICIT: 120 cc  COMPLICATIONS: None  DISPOSITION: PACU - hemodynamically stable.  CONDITION: stable  FINDINGS: Exam under anesthesia revealed  9 cm uterus with bilateral adnexa without masses or fullness. Hysteroscopy revealed endometrial polyps inside the uterine cavity with bilateral tubal ostia and normal appearing endocervical canal.  PROCEDURE IN DETAIL: After informed consent was obtained, the patient was taken to the operating room where anesthesia was obtained without difficulty. The patient was positioned in the dorsal lithotomy position in [place] stirrups. The patient's bladder was catheterized with an in and out foley catheter. The patient was examined under anesthesia, with the above noted findings. The weightedspeculum was placed inside the patient's vagina, and the the anterior lip of the cervix was seen and grasped with the tenaculum.  The uterine cavity was sounded to 9 cm, and then the cervix was progressively dilated to a 18 French-Pratt dilator. The 0 degree hysteroscope was introduced, with saline fluid used to distend the intrauterine cavity, with the above noted findings.  The Myosure was used to remove the uterine polyps and sample the endometrial cavity. Once the cavity was sampled entirely the hysteroscope was removed.   She was then taken out of dorsal lithotomy. Minimal discrepancy in fluid was noted.  The patient tolerated the procedure well. Sponge, lap and needle counts were correct x2. The patient was taken to recovery room in excellent condition.  Adelene Idler MD [facility], [facility] 09/22/2021 7:36 PM

## 2021-09-22 NOTE — Anesthesia Preprocedure Evaluation (Signed)
Anesthesia Evaluation  Patient identified by MRN, date of birth, ID band Patient awake    Reviewed: Allergy & Precautions, H&P , NPO status , Patient's Chart, lab work & pertinent test results, reviewed documented beta blocker date and time   Airway Mallampati: II  TM Distance: >3 FB Neck ROM: full    Dental  (+) Teeth Intact   Pulmonary neg pulmonary ROS,    Pulmonary exam normal        Cardiovascular Exercise Tolerance: Good hypertension, On Medications negative cardio ROS Normal cardiovascular exam Rate:Normal     Neuro/Psych negative neurological ROS  negative psych ROS   GI/Hepatic negative GI ROS, Neg liver ROS, GERD  Medicated and Controlled,  Endo/Other  negative endocrine ROSWell Controlled, Type 2, Oral Hypoglycemic Agents  Renal/GU negative Renal ROS  negative genitourinary   Musculoskeletal   Abdominal   Peds  Hematology negative hematology ROS (+)   Anesthesia Other Findings   Reproductive/Obstetrics negative OB ROS                             Anesthesia Physical Anesthesia Plan  ASA: 2  Anesthesia Plan: General LMA   Post-op Pain Management:    Induction:   PONV Risk Score and Plan:   Airway Management Planned:   Additional Equipment:   Intra-op Plan:   Post-operative Plan:   Informed Consent: I have reviewed the patients History and Physical, chart, labs and discussed the procedure including the risks, benefits and alternatives for the proposed anesthesia with the patient or authorized representative who has indicated his/her understanding and acceptance.       Plan Discussed with: CRNA  Anesthesia Plan Comments:         Anesthesia Quick Evaluation

## 2021-09-22 NOTE — Anesthesia Procedure Notes (Signed)
Procedure Name: LMA Insertion Date/Time: 09/22/2021 6:05 PM Performed by: Stormy Fabian, CRNA Pre-anesthesia Checklist: Patient identified, Patient being monitored, Timeout performed, Emergency Drugs available and Suction available Patient Re-evaluated:Patient Re-evaluated prior to induction Oxygen Delivery Method: Circle system utilized Preoxygenation: Pre-oxygenation with 100% oxygen Induction Type: IV induction Ventilation: Mask ventilation without difficulty LMA: LMA inserted LMA Size: 3.5 and 4.0 Tube type: Oral Number of attempts: 2 Placement Confirmation: positive ETCO2 and breath sounds checked- equal and bilateral Tube secured with: Tape Dental Injury: Teeth and Oropharynx as per pre-operative assessment

## 2021-09-22 NOTE — H&P (Signed)
Rose Roberts is an 70 y.o. female.   Chief Complaint: Postmenopausal bleeding HPI: Patient has been having postmenopausal bleeding. Biopsy in office showed necrotic tumor. Hysteroscopy D&C today to obtain further sample as requested by gynecology oncology.    Past Medical History:  Diagnosis Date   GERD (gastroesophageal reflux disease)    Hyperlipidemia    Hypertension    Osteoarthritis of right knee     Past Surgical History:  Procedure Laterality Date   APPENDECTOMY     BILATERAL CARPAL TUNNEL RELEASE     CESAREAN SECTION  1973   TOTAL KNEE ARTHROPLASTY Right 11/23/2018   Procedure: TOTAL KNEE ARTHROPLASTY-RIGHT INTERPRETER APPOINTMENT;  Surgeon: Corky Mull, MD;  Location: [facility] ORS;  Service: Orthopedics;  Laterality: Right;   TOTAL KNEE ARTHROPLASTY Left 06/19/2019   Procedure: TOTAL KNEE ARTHROPLASTY;  Surgeon: Corky Mull, MD;  Location: [facility] ORS;  Service: Orthopedics;  Laterality: Left;   TUBAL LIGATION      Family History  Problem Relation Age of Onset   Cancer Father    Ovarian cancer Cousin    Breast cancer Cousin    Uterine cancer Cousin    Social History:  reports that she has never smoked. She has never used smokeless tobacco. She reports that she does not drink alcohol and does not use drugs.  Allergies: No Known Allergies  Medications Prior to Admission  Medication Sig Dispense Refill   aspirin 81 MG EC tablet Take by mouth. (Patient not taking: Reported on 09/21/2021)     atorvastatin (LIPITOR) 20 MG tablet Take 20 mg by mouth at bedtime.      enoxaparin (LOVENOX) 40 MG/0.4ML injection Inject 0.4 mLs (40 mg total) into the skin daily. (Patient not taking: Reported on 06/02/2020) 5.6 mL 0   hydrochlorothiazide (MICROZIDE) 12.5 MG capsule Take 12.5 mg by mouth daily with lunch.     meloxicam (MOBIC) 15 MG tablet Take 15 mg by mouth daily as needed for pain.  (Patient not taking: Reported on 06/02/2020)     metFORMIN (GLUCOPHAGE) 850 MG tablet Take by  mouth. (Patient not taking: Reported on 09/21/2021)     Multiple Vitamin (MULTIVITAMIN WITH MINERALS) TABS tablet Take 1 tablet by mouth daily. (Patient not taking: Reported on 06/02/2020)     oxyCODONE (OXY IR/ROXICODONE) 5 MG immediate release tablet Take 1-2 tablets (5-10 mg total) by mouth every 4 (four) hours as needed for moderate pain. (Patient not taking: Reported on 06/02/2020) 60 tablet 0   traMADol (ULTRAM) 50 MG tablet Take 1 tablet (50 mg total) by mouth every 6 (six) hours as needed for moderate pain. (Patient not taking: Reported on 06/02/2020) 40 tablet 0   triamcinolone cream (KENALOG) 0.5 % Apply topically. (Patient not taking: Reported on 09/21/2021)      Results for orders placed or performed during the hospital encounter of 09/22/21 (from the past 48 hour(s))  Glucose, capillary     Status: Abnormal   Collection Time: 09/22/21 11:23 AM  Result Value Ref Range   Glucose-Capillary 187 (H) 70 - 99 mg/dL    Comment: Glucose reference range applies only to samples taken after fasting for at least 8 hours.  Glucose, capillary     Status: None   Collection Time: 09/22/21  4:42 PM  Result Value Ref Range   Glucose-Capillary 85 70 - 99 mg/dL    Comment: Glucose reference range applies only to samples taken after fasting for at least 8 hours.   CT ABDOMEN PELVIS W  CONTRAST  Result Date: 09/21/2021 CLINICAL DATA:  Uterine cancer staging EXAM: CT ABDOMEN AND PELVIS WITH CONTRAST TECHNIQUE: Multidetector CT imaging of the abdomen and pelvis was performed using the standard protocol following bolus administration of intravenous contrast. CONTRAST:  OMNIPAQUE IOHEXOL 300 MG/ML  SOLN COMPARISON:  Pelvic US 07/24/2021. FINDINGS: Lower chest: No acute abnormality. Hepatobiliary: No suspicious hepatic lesion. Diffuse hepatic steatosis. Cholelithiasis without findings of acute cholecystitis. No biliary ductal dilation. Pancreas: No pancreatic ductal dilation or evidence of acute inflammation.  Spleen: Within normal limits. Adrenals/Urinary Tract: Bilateral adrenal glands are unremarkable. No hydronephrosis. No solid enhancing renal mass. Urinary bladder is unremarkable for degree of distension. Stomach/Bowel: Radiopaque enteric contrast material traverses distal loops of small bowel. Stomach is unremarkable for degree of distension. No pathologic dilation of small or large bowel. Terminal ileum is normal. The appendix is not confidently identified however there is no pericecal inflammation. Vascular/Lymphatic: Aortic and branch vessel atherosclerosis without abdominal aortic aneurysm. No pathologically enlarged abdominal or pelvic lymph nodes. Reproductive: Enhancing solid echogenic endometrial mass measuring 2.2 cm on image 76/2. No suspicious adnexal lesion. Other: No significant abdominopelvic free fluid. No pneumoperitoneum. No discrete peritoneal or omental nodularity. Musculoskeletal: Thoracolumbar spondylosis. No aggressive lytic or blastic lesion of bone. IMPRESSION: 1. Enhancing solid echogenic endometrial mass measuring 2.2 cm, consistent with known endometrial carcinoma. 2. No evidence of metastatic disease within the abdomen or pelvis. 3. Diffuse hepatic steatosis. 4. Cholelithiasis without findings of acute cholecystitis. 5.  Aortic Atherosclerosis (ICD10-I70.0). Electronically Signed   By: Maudry Mayhew M.D.   On: 09/21/2021 17:21    Review of Systems  Constitutional:  Negative for chills and fever.  HENT:  Negative for congestion, hearing loss and sinus pain.   Respiratory:  Negative for cough, shortness of breath and wheezing.   Cardiovascular:  Negative for chest pain, palpitations and leg swelling.  Gastrointestinal:  Negative for abdominal pain, constipation, diarrhea, nausea and vomiting.  Genitourinary:  Negative for dysuria, flank pain, frequency, hematuria and urgency.  Musculoskeletal:  Negative for back pain.  Skin:  Negative for rash.  Neurological:  Negative for  dizziness and headaches.  Psychiatric/Behavioral:  Negative for suicidal ideas. The patient is not nervous/anxious.    Blood pressure (!) 154/73, pulse 86, temperature (!) 97 F (36.1 C), temperature source Temporal, resp. rate 18, height 5' 0.5" (1.537 m), weight 81.6 kg, SpO2 97 %. Physical Exam Vitals and nursing note reviewed.  Constitutional:      Appearance: Normal appearance. She is well-developed.  HENT:     Head: Normocephalic and atraumatic.  Cardiovascular:     Rate and Rhythm: Normal rate and regular rhythm.  Pulmonary:     Effort: Pulmonary effort is normal.     Breath sounds: Normal breath sounds.  Abdominal:     General: Bowel sounds are normal.     Palpations: Abdomen is soft.  Musculoskeletal:        General: Normal range of motion.  Skin:    General: Skin is warm and dry.  Neurological:     Mental Status: She is alert and oriented to person, place, and time.  Psychiatric:        Behavior: Behavior normal.        Thought Content: Thought content normal.        Judgment: Judgment normal.     Assessment/Plan 70 yo [id number] with postmenopausal bleeding and suspected endometrial cancer We will proceed with hysteroscopy D&C.  Discussed risk bleeding infection, perforation, and damage to  surrounding pelvic organs. Consents were signed. All questions answered. Interpreter used to speak with patient.       Homero Fellers, MD 09/22/2021, 5:23 PM

## 2021-09-22 NOTE — Transfer of Care (Signed)
Immediate Anesthesia Transfer of Care Note  Patient: Rose Roberts  Procedure(s) Performed: Procedure(s): DILATATION AND CURETTAGE /HYSTEROSCOPY (N/A)  Patient Location: PACU  Anesthesia Type:General  Level of Consciousness: sedated  Airway & Oxygen Therapy: Patient Spontanous Breathing and Patient connected to face mask oxygen  Post-op Assessment: Report given to RN and Post -op Vital signs reviewed and stable  Post vital signs: Reviewed and stable  Last Vitals:  Vitals:   09/22/21 1043 09/22/21 1856  BP: (!) 154/73 (!) 146/86  Pulse: 86 81  Resp: 18 (!) 24  Temp: (!) 36.1 C (!) 36 C  SpO2: 97% 100%    Complications: No apparent anesthesia complications

## 2021-09-23 ENCOUNTER — Encounter: Payer: Self-pay | Admitting: Obstetrics and Gynecology

## 2021-09-24 ENCOUNTER — Ambulatory Visit: Payer: Medicare Other | Admitting: Obstetrics and Gynecology

## 2021-09-24 LAB — SURGICAL PATHOLOGY

## 2021-09-25 ENCOUNTER — Telehealth: Payer: Self-pay | Admitting: *Deleted

## 2021-09-25 ENCOUNTER — Telehealth: Payer: Self-pay

## 2021-09-25 NOTE — Telephone Encounter (Signed)
Received notification from Dr. Jerene Pitch that based on her pathology she will not need to be seen by gyn oncology. Will notify new patient coordinator.

## 2021-09-25 NOTE — Telephone Encounter (Signed)
Left a message with the patient's granddaughter to call the office back to scheduled a new patient appt with Dr Pricilla Holm

## 2021-10-01 ENCOUNTER — Encounter: Payer: Self-pay | Admitting: Obstetrics and Gynecology

## 2021-10-01 ENCOUNTER — Other Ambulatory Visit: Payer: Self-pay

## 2021-10-01 ENCOUNTER — Ambulatory Visit (INDEPENDENT_AMBULATORY_CARE_PROVIDER_SITE_OTHER): Payer: Medicare Other | Admitting: Obstetrics and Gynecology

## 2021-10-01 VITALS — BP 128/70 | Ht 65.0 in | Wt 206.4 lb

## 2021-10-01 DIAGNOSIS — Z4889 Encounter for other specified surgical aftercare: Secondary | ICD-10-CM

## 2021-10-01 NOTE — Progress Notes (Signed)
°  Postoperative Follow-up Patient presents post op from  Hysteroscopy with Dilation and Curettage and Hysteroscopy with Myosure Polypectomy  for  thickened endometrium, 1 week ago.  Subjective: She reports that since the surgery she has continued to have some crampy abdominal pain and small spotting. Patient reports some improvement in her preop symptoms. Eating a regular diet without difficulty.  Pain is controlled without any medications.   Activity: normal activities of daily living.   Objective: BP 128/70    Ht 5\' 5"  (1.651 m)    Wt 206 lb 6.4 oz (93.6 kg)    BMI 34.35 kg/m  Physical Exam Constitutional:      Appearance: Normal appearance. She is well-developed.  HENT:     Head: Normocephalic and atraumatic.  Eyes:     Extraocular Movements: Extraocular movements intact.     Pupils: Pupils are equal, round, and reactive to light.  Neck:     Thyroid: No thyromegaly.  Cardiovascular:     Rate and Rhythm: Normal rate and regular rhythm.     Heart sounds: Normal heart sounds.  Pulmonary:     Effort: Pulmonary effort is normal.     Breath sounds: Normal breath sounds.  Abdominal:     General: Bowel sounds are normal. There is no distension.     Palpations: Abdomen is soft. There is no mass.  Musculoskeletal:     Cervical back: Neck supple.  Neurological:     Mental Status: She is alert and oriented to person, place, and time.  Skin:    General: Skin is warm and dry.  Psychiatric:        Behavior: Behavior normal.        Thought Content: Thought content normal.        Judgment: Judgment normal.  Vitals reviewed.    Assessment: s/p :  Hysteroscopy with Dilation and Curettage and Hysteroscopy with Myosure Polypectomy  stable  Plan:  Patient has done well after surgery with no apparent complications.  Reviewed the normal pathology result.   Discussed that the pathologist reviewed the specimen at my request because of the tumor suggested by the EMB and did not see  hyperplasia or malignancy.   I have discussed the post-operative course to date, and the expected progress moving forward.  The patient understands what complications to be concerned about.  I will see the patient in routine follow up, or sooner if needed.    Activity plan: No restriction. MD, [facility] 10/01/2021 2:17 PM

## 2021-12-30 ENCOUNTER — Other Ambulatory Visit: Payer: Self-pay

## 2021-12-30 ENCOUNTER — Encounter: Payer: Self-pay | Admitting: Obstetrics and Gynecology

## 2021-12-30 ENCOUNTER — Ambulatory Visit (INDEPENDENT_AMBULATORY_CARE_PROVIDER_SITE_OTHER): Payer: Medicare Other | Admitting: Obstetrics and Gynecology

## 2021-12-30 VITALS — BP 140/80 | Wt 209.0 lb

## 2021-12-30 DIAGNOSIS — N907 Vulvar cyst: Secondary | ICD-10-CM | POA: Diagnosis not present

## 2021-12-30 NOTE — Progress Notes (Signed)
? ?Patient ID: Rose Roberts, female   DOB: [birth year], 71 y.o.   MRN: [id number] ? ?Reason for Consult: Follow-up ?  ?Referred by Preston Fleeting* ? ?Subjective:  ?   ?HPI: ? ?Rose Roberts is a 71 y.o. female. She reports she has had no further episodes of vaginal bleeding. She has a new concern of a bump on her left labia.  ? ?Gynecological History ? ?No LMP recorded. Patient is postmenopausal. ? ?Past Medical History:  ?Diagnosis Date  ? GERD (gastroesophageal reflux disease)   ? Hyperlipidemia   ? Hypertension   ? Osteoarthritis of right knee   ? ?Family History  ?Problem Relation Age of Onset  ? Cancer Father   ? Ovarian cancer Cousin   ? Breast cancer Cousin   ? Uterine cancer Cousin   ? ?Past Surgical History:  ?Procedure Laterality Date  ? APPENDECTOMY    ? BILATERAL CARPAL TUNNEL RELEASE    ? CESAREAN SECTION  1973  ? HYSTEROSCOPY WITH D & C N/A 09/22/2021  ? Procedure: DILATATION AND CURETTAGE /HYSTEROSCOPY and Polypectomy;  Surgeon: Natale Milch, MD;  Location: [facility] ORS;  Service: Gynecology;  Laterality: N/A;  ? TOTAL KNEE ARTHROPLASTY Right 11/23/2018  ? Procedure: TOTAL KNEE ARTHROPLASTY-RIGHT INTERPRETER APPOINTMENT;  Surgeon: Christena Flake, MD;  Location: [facility] OR[facility];  Service: Orthopedics;  Laterality: Right;  ? TOTAL KNEE ARTHROPLASTY Left 06/19/2019  ? Procedure: TOTAL KNEE ARTHROPLASTY;  Surgeon: Christena Flake, MD;  Location: [facility];  Service: Orthopedics;  Laterality: Left;  ? TUBAL LIGATION    ? ? ?Short Social History:  ?Social History  ? ?Tobacco Use  ? Smoking status: Never  ? Smokeless tobacco: Never  ?Substance Use Topics  ? Alcohol use: No  ? ? ?No Known Allergies ? ?Current Outpatient Medications  ?Medication Sig Dispense Refill  ? acetaminophen (TYLENOL) 500 MG tablet Take 2 tablets (1,000 mg total) by mouth every 6 (six) hours as needed. 30 tablet 0  ? aspirin 81 MG EC tablet Take by mouth.    ? atorvastatin (LIPITOR) 20 MG tablet Take 20 mg by mouth at bedtime.      ? enoxaparin (LOVENOX) 40 MG/0.4ML injection Inject 0.4 mLs (40 mg total) into the skin daily. 5.6 mL 0  ? hydrochlorothiazide (MICROZIDE) 12.5 MG capsule Take 12.5 mg by mouth daily with lunch.    ? meloxicam (MOBIC) 15 MG tablet Take 15 mg by mouth daily as needed for pain.    ? metFORMIN (GLUCOPHAGE) 850 MG tablet Take by mouth.    ? Multiple Vitamin (MULTIVITAMIN WITH MINERALS) TABS tablet Take 1 tablet by mouth daily.    ? traMADol (ULTRAM) 50 MG tablet Take 1 tablet (50 mg total) by mouth every 6 (six) hours as needed for moderate pain. 40 tablet 0  ? triamcinolone cream (KENALOG) 0.5 % Apply topically.    ? oxyCODONE (OXY IR/ROXICODONE) 5 MG immediate release tablet Take 1-2 tablets (5-10 mg total) by mouth every 4 (four) hours as needed for moderate pain. (Patient not taking: Reported on 12/30/2021) 60 tablet 0  ? ?No current facility-administered medications for this visit.  ? ? ?Review of Systems  ?Constitutional: Negative for chills, fatigue, fever and unexpected weight change.  ?HENT: Negative for trouble swallowing.  ?Eyes: Negative for loss of vision.  ?Respiratory: Negative for cough, shortness of breath and wheezing.  ?Cardiovascular: Negative for chest pain, leg swelling, palpitations and syncope.  ?GI: Negative for abdominal pain, blood in stool, diarrhea, nausea  and vomiting.  ?GU: Negative for difficulty urinating, dysuria, frequency and hematuria.  ?Musculoskeletal: Negative for back pain, leg pain and joint pain.  ?Skin: Negative for rash.  ?Neurological: Negative for dizziness, headaches, light-headedness, numbness and seizures.  ?Psychiatric: Negative for behavioral problem, confusion, depressed mood and sleep disturbance.   ? ?   ?Objective:  ?Objective  ? ?Vitals:  ? 12/30/21 1044  ?BP: 140/80  ?Weight: 209 lb (94.8 kg)  ? ?Body mass index is 34.78 kg/m?. ? ?Physical Exam ?Vitals and nursing note reviewed. Exam conducted with a chaperone present.  ?Constitutional:   ?   Appearance: Normal  appearance. She is well-developed.  ?HENT:  ?   Head: Normocephalic and atraumatic.  ?Eyes:  ?   Extraocular Movements: Extraocular movements intact.  ?   Pupils: Pupils are equal, round, and reactive to light.  ?Cardiovascular:  ?   Rate and Rhythm: Normal rate and regular rhythm.  ?Pulmonary:  ?   Effort: Pulmonary effort is normal. No respiratory distress.  ?   Breath sounds: Normal breath sounds.  ?Abdominal:  ?   General: Abdomen is flat.  ?   Palpations: Abdomen is soft.  ?Genitourinary: ? ? ?Musculoskeletal:     ?   General: No signs of injury.  ?Skin: ?   General: Skin is warm and dry.  ?Neurological:  ?   Mental Status: She is alert and oriented to person, place, and time.  ?Psychiatric:     ?   Behavior: Behavior normal.     ?   Thought Content: Thought content normal.     ?   Judgment: Judgment normal.  ? ? ?Assessment/Plan:  ?  ? ?71 yo with left labial sebaceous cyst. Discussed that this is not a cancerours finding. Discussed that she can soak in a warm bath which may aid in drainage or they can be incised and removed in office. Follow up as needed ? ?More than 10 minutes were spent face to face with the patient in the room, reviewing the medical record, labs and images, and coordinating care for the patient. The plan of management was discussed in detail and counseling was provided.  ? ?  ?Adelene Idler MD ?[facility], [facility] ?12/30/2021 ?11:50 AM ? ? ? ?

## 2022-06-10 ENCOUNTER — Other Ambulatory Visit: Payer: Self-pay | Admitting: Nurse Practitioner

## 2022-06-10 DIAGNOSIS — Z1231 Encounter for screening mammogram for malignant neoplasm of breast: Secondary | ICD-10-CM

## 2022-06-16 ENCOUNTER — Other Ambulatory Visit: Payer: Self-pay | Admitting: Nurse Practitioner

## 2022-06-16 DIAGNOSIS — Z780 Asymptomatic menopausal state: Secondary | ICD-10-CM

## 2022-07-26 ENCOUNTER — Ambulatory Visit (INDEPENDENT_AMBULATORY_CARE_PROVIDER_SITE_OTHER): Payer: Medicare Other | Admitting: Vascular Surgery

## 2022-09-16 ENCOUNTER — Ambulatory Visit (INDEPENDENT_AMBULATORY_CARE_PROVIDER_SITE_OTHER): Payer: Medicare Other | Admitting: Vascular Surgery

## 2022-09-16 NOTE — Progress Notes (Deleted)
MRN : [id number]  Rose Roberts is a 71 y.o. (Sep 09, 1951) female who presents with chief complaint of varicose veins hurt.  History of Present Illness:  The patient is seen for evaluation of symptomatic varicose veins. The patient relates burning and stinging which worsened steadily throughout the course of the day, particularly with standing. The patient also notes an aching and throbbing pain over the varicosities, particularly with prolonged dependent positions. The symptoms are significantly improved with elevation.  The patient also notes that during hot weather the symptoms are greatly intensified. The patient states the pain from the varicose veins interferes with work, daily exercise, shopping and household maintenance. At this point, the symptoms are persistent and severe enough that they're having a negative impact on lifestyle and are interfering with daily activities.  There is no history of DVT, PE or superficial thrombophlebitis. There is no history of ulceration or hemorrhage. The patient denies a significant family history of varicose veins.  The patient has not worn graduated compression in the past. At the present time the patient has not been using over-the-counter analgesics. There is no history of prior surgical intervention or sclerotherapy.   No outpatient medications have been marked as taking for the 09/16/22 encounter (Appointment) with Gilda Crease, Latina Craver, MD.    Past Medical History:  Diagnosis Date   GERD (gastroesophageal reflux disease)    Hyperlipidemia    Hypertension    Osteoarthritis of right knee     Past Surgical History:  Procedure Laterality Date   APPENDECTOMY     BILATERAL CARPAL TUNNEL RELEASE     CESAREAN SECTION  1973   HYSTEROSCOPY WITH D & C N/A 09/22/2021   Procedure: DILATATION AND CURETTAGE /HYSTEROSCOPY and Polypectomy;  Surgeon: Natale Milch, MD;  Location: [facility] OR[facility];  Service: Gynecology;  Laterality: N/A;   TOTAL  KNEE ARTHROPLASTY Right 11/23/2018   Procedure: TOTAL KNEE ARTHROPLASTY-RIGHT INTERPRETER APPOINTMENT;  Surgeon: Christena Flake, MD;  Location: [facility];  Service: Orthopedics;  Laterality: Right;   TOTAL KNEE ARTHROPLASTY Left 06/19/2019   Procedure: TOTAL KNEE ARTHROPLASTY;  Surgeon: Christena Flake, MD;  Location: [facility];  Service: Orthopedics;  Laterality: Left;   TUBAL LIGATION      Social History Social History   Tobacco Use   Smoking status: Never   Smokeless tobacco: Never  Vaping Use   Vaping Use: Never used  Substance Use Topics   Alcohol use: No   Drug use: No    Family History Family History  Problem Relation Age of Onset   Cancer Father    Ovarian cancer Cousin    Breast cancer Cousin    Uterine cancer Cousin     No Known Allergies   REVIEW OF SYSTEMS (Negative unless checked)  Constitutional: [] Weight loss  [] Fever  [] Chills Cardiac: [] Chest pain   [] Chest pressure   [] Palpitations   [] Shortness of breath when laying flat   [] Shortness of breath with exertion. Vascular:  [] Pain in legs with walking   [x] Pain in legs with standing  [] History of DVT   [] Phlebitis   [] Swelling in legs   [x] Varicose veins   [] Non-healing ulcers Pulmonary:   [] Uses home oxygen   [] Productive cough   [] Hemoptysis   [] Wheeze  [] COPD   [] Asthma Neurologic:  [] Dizziness   [] Seizures   [] History of stroke   [] History of TIA  [] Aphasia   [] Vissual changes   [] Weakness or numbness in arm   [] Weakness or numbness in  leg Musculoskeletal:   [] Joint swelling   [] Joint pain   [] Low back pain Hematologic:  [] Easy bruising  [] Easy bleeding   [] Hypercoagulable state   [] Anemic Gastrointestinal:  [] Diarrhea   [] Vomiting  [] Gastroesophageal reflux/heartburn   [] Difficulty swallowing. Genitourinary:  [] Chronic kidney disease   [] Difficult urination  [] Frequent urination   [] Blood in urine Skin:  [] Rashes   [] Ulcers  Psychological:  [] History of anxiety   []  History of major depression.  Physical  Examination  There were no vitals filed for this visit. There is no height or weight on file to calculate BMI. Gen: WD/WN, NAD Head: [place]/AT, No temporalis wasting.  Ear/Nose/Throat: Hearing grossly intact, nares w/o erythema or drainage, pinna without lesions Eyes: PER, EOMI, sclera nonicteric.  Neck: Supple, no gross masses.  No JVD.  Pulmonary:  Good air movement, no audible wheezing, no use of accessory muscles.  Cardiac: RRR, precordium not hyperdynamic. Vascular:  Large varicosities present, greater than 10 mm ***.  Veins are tender to palpation  Mild venous stasis changes to the legs bilaterally.  Trace soft pitting edema CEAP [id number] Vessel Right Left  Radial Palpable Palpable  Gastrointestinal: soft, non-distended. No guarding/no peritoneal signs.  Musculoskeletal: M/S 5/5 throughout.  No deformity.  Neurologic: CN 2-12 intact. Pain and light touch intact in extremities.  Symmetrical.  Speech is fluent. Motor exam as listed above. Psychiatric: Judgment intact, Mood & affect appropriate for pt's clinical situation. Dermatologic: Venous rashes no ulcers noted.  No changes consistent with cellulitis. Lymph : No lichenification or skin changes of chronic lymphedema.  CBC Lab Results  Component Value Date   WBC 9.1 09/21/2021   HGB 14.3 09/21/2021   HCT 45.4 09/21/2021   MCV 83.6 09/21/2021   PLT 282 09/21/2021    BMET    Component Value Date/Time   NA 140 09/21/2021 1539   K 3.8 09/21/2021 1539   CL 103 09/21/2021 1539   CO2 30 09/21/2021 1539   GLUCOSE 91 09/21/2021 1539   BUN 14 09/21/2021 1539   CREATININE 0.66 09/21/2021 1539   CALCIUM 9.4 09/21/2021 1539   GFRNONAA >60 09/21/2021 1539   GFRAA >60 06/22/2019 0414   CrCl cannot be calculated (Patient's most recent lab result is older than the maximum 21 days allowed.).  COAG Lab Results  Component Value Date   INR 1.0 07/24/2021    Radiology No results found.   Assessment/Plan There are no diagnoses  linked to this encounter.   , MD  09/16/2022 12:37 PM

## 2022-11-26 ENCOUNTER — Encounter: Payer: Self-pay | Admitting: Obstetrics & Gynecology

## 2022-11-30 ENCOUNTER — Encounter: Payer: Self-pay | Admitting: Obstetrics & Gynecology

## 2022-11-30 ENCOUNTER — Ambulatory Visit (INDEPENDENT_AMBULATORY_CARE_PROVIDER_SITE_OTHER): Payer: Medicare Other | Admitting: Obstetrics & Gynecology

## 2022-11-30 VITALS — BP 135/73 | HR 91 | Ht 61.0 in | Wt 196.0 lb

## 2022-11-30 DIAGNOSIS — Z01419 Encounter for gynecological examination (general) (routine) without abnormal findings: Secondary | ICD-10-CM

## 2022-11-30 DIAGNOSIS — Z1231 Encounter for screening mammogram for malignant neoplasm of breast: Secondary | ICD-10-CM

## 2022-11-30 DIAGNOSIS — N950 Postmenopausal bleeding: Secondary | ICD-10-CM

## 2022-11-30 NOTE — Progress Notes (Signed)
Subjective:    Rose Roberts is a 72 y.o. single P4 who presents for an annual exam. She reports an occasion of PMB in 07/2022. She has a h/o PMB in 2022 with EMBX with abnormal finding followed by d&c, hysteroscopy with normal pathology.  The patient is not currently sexually active, for decades. GYN screening history: last pap: was normal. The patient wears seatbelts: yes. The patient participates in regular exercise: not asked. Has the patient ever been transfused or tattooed?: not asked. The patient reports that there is not domestic violence in her life.   Menstrual History: OB History     Gravida  4   Para  4   Term  4   Preterm      AB      Living  4      SAB      IAB      Ectopic      Multiple      Live Births               No LMP recorded. Patient is postmenopausal.    The following portions of the patient's history were reviewed and updated as appropriate: allergies, current medications, past family history, past medical history, past social history, past surgical history, and problem list.  Review of Systems Pertinent items are noted in HPI.  FH- no breast or colon cancers. + gyn cancer in her cousin   Objective:    BP 135/73   Pulse 91   Ht 5' 1"$  (1.549 m)   Wt 196 lb (88.9 kg)   BMI 37.03 kg/m   General Appearance:    Alert, cooperative, no distress, appears stated age  Head:    Normocephalic, without obvious abnormality, atraumatic  Eyes:    PERRL, conjunctiva/corneas clear, EOM's intact, fundi    benign, both eyes  Ears:    Normal TM's and external ear canals, both ears  Nose:   Nares normal, septum midline, mucosa normal, no drainage    or sinus tenderness  Throat:   Lips, mucosa, and tongue normal; teeth and gums normal  Neck:   Supple, symmetrical, trachea midline, no adenopathy;    thyroid:  no enlargement/tenderness/nodules; no carotid   bruit or JVD  Back:     Symmetric, no curvature, ROM normal, no CVA tenderness  Lungs:      Clear to auscultation bilaterally, respirations unlabored  Chest Wall:    No tenderness or deformity   Heart:    Regular rate and rhythm, S1 and S2 normal, no murmur, rub   or gallop  Breast Exam:    No tenderness, masses, or nipple abnormality  Abdomen:     Soft, non-tender, bowel sounds active all four quadrants,    no masses, no organomegaly  Genitalia:    Normal female without lesion, discharge or tenderness, severe VVA, Rose Roberts spec reveals only atrophy of vagina and cervix, normal size and shape, anteverted uterus, normal adnexal exam      Extremities:   Extremities normal, atraumatic, no cyanosis or edema  Pulses:   2+ and symmetric all extremities  Skin:   Skin color, texture, turgor normal, no rashes or lesions  Lymph nodes:   Cervical, supraclavicular, and axillary nodes normal  Neurologic:   CNII-XII intact, normal strength, sensation and reflexes    throughout  .    Assessment:    Healthy female exam.  PMB   Plan:     Mammogram. Pelvic ultrasound  She will come  back when ultrasound results are available

## 2022-12-01 ENCOUNTER — Encounter: Payer: Medicare Other | Admitting: Obstetrics and Gynecology

## 2022-12-09 ENCOUNTER — Ambulatory Visit
Admission: RE | Admit: 2022-12-09 | Discharge: 2022-12-09 | Disposition: A | Discharge status: Home / Self Care | Payer: Medicare Other | Source: Ambulatory Visit | Attending: Obstetrics & Gynecology | Admitting: Obstetrics & Gynecology

## 2022-12-09 DIAGNOSIS — N950 Postmenopausal bleeding: Secondary | ICD-10-CM | POA: Diagnosis not present

## 2022-12-13 ENCOUNTER — Other Ambulatory Visit (HOSPITAL_COMMUNITY)
Admission: RE | Admit: 2022-12-13 | Discharge: 2022-12-13 | Disposition: A | Discharge status: Home / Self Care | Payer: Medicare Other | Source: Ambulatory Visit | Attending: Obstetrics & Gynecology | Admitting: Obstetrics & Gynecology

## 2022-12-13 ENCOUNTER — Encounter: Payer: Self-pay | Admitting: Obstetrics & Gynecology

## 2022-12-13 ENCOUNTER — Ambulatory Visit (INDEPENDENT_AMBULATORY_CARE_PROVIDER_SITE_OTHER): Payer: Medicare Other | Admitting: Obstetrics & Gynecology

## 2022-12-13 ENCOUNTER — Telehealth: Payer: Self-pay

## 2022-12-13 VITALS — BP 145/76 | HR 92 | Ht 65.0 in | Wt 198.0 lb

## 2022-12-13 DIAGNOSIS — R9389 Abnormal findings on diagnostic imaging of other specified body structures: Secondary | ICD-10-CM

## 2022-12-13 DIAGNOSIS — Z789 Other specified health status: Secondary | ICD-10-CM

## 2022-12-13 DIAGNOSIS — N950 Postmenopausal bleeding: Secondary | ICD-10-CM | POA: Diagnosis present

## 2022-12-13 NOTE — Progress Notes (Signed)
    GYNECOLOGY PROGRESS NOTE  Subjective:    Patient ID: Rose Roberts, female    DOB: [birth year], 72 y.o.   MRN: [id number]  HPI  Patient is a 72 y.o. single [id number] female who presents for [facility] due to PMB 07/2022 and 8 mm endometrium seen on ultrasound recently.  She has a h/o PMB in 2022 with EMBX with abnormal finding followed by d&c, hysteroscopy with normal pathology.      Review of Systems    Objective:   Blood pressure (!) 145/76, pulse 92, height 5' 5"$  (1.651 m), weight 198 lb (89.8 kg). Body mass index is 32.95 kg/m. Interpretor present for visit General appearance: alert Abdomen: soft, non-tender; bowel sounds normal; no masses,  no organomegaly Pelvic: cervix normal in appearance, external genitalia normal, no adnexal masses or tenderness, no cervical motion tenderness, uterus normal size, shape, and consistency, and vagina normal without discharge Extremities: extremities normal, atraumatic, no cyanosis or edema Neurologic: Grossly normal    Consent signed, time out done Cervix prepped with betadine and sprayed with Hurricaine spray. I then grasped with a single tooth tenaculum. Uterus sounded to 8 cm Pipelle used for 2 passes with a small amount of tissue obtained. She tolerated the procedure well.    Assessment:   1. Postmenopausal bleeding   2. Thickened endometrium      Plan:   1. Postmenopausal bleeding - await pathology - I will call her with results  2. Thickened endometrium - As above

## 2022-12-13 NOTE — Telephone Encounter (Signed)
Pt called after hour nurse 12/10/22 3:37 pm requesting a call back and is requesting a Spanish speaker.  [phone]

## 2022-12-13 NOTE — Telephone Encounter (Signed)
Pt called to confirm her appt for this PM. Aware appt is at 3:15 pm.

## 2022-12-14 ENCOUNTER — Encounter: Payer: Self-pay | Admitting: Obstetrics & Gynecology

## 2022-12-15 LAB — SURGICAL PATHOLOGY

## 2022-12-29 ENCOUNTER — Ambulatory Visit
Admission: RE | Admit: 2022-12-29 | Discharge: 2022-12-29 | Disposition: A | Discharge status: Home / Self Care | Payer: Medicare Other | Source: Ambulatory Visit | Attending: Nurse Practitioner | Admitting: Nurse Practitioner

## 2022-12-29 DIAGNOSIS — Z1231 Encounter for screening mammogram for malignant neoplasm of breast: Secondary | ICD-10-CM | POA: Insufficient documentation

## 2023-01-27 ENCOUNTER — Ambulatory Visit: Payer: Medicare Other | Admitting: Dermatology

## 2023-09-08 ENCOUNTER — Other Ambulatory Visit: Payer: Self-pay | Admitting: Podiatry

## 2023-09-13 ENCOUNTER — Encounter: Payer: Self-pay | Admitting: Podiatry

## 2023-09-14 ENCOUNTER — Other Ambulatory Visit
Admission: RE | Admit: 2023-09-14 | Discharge: 2023-09-14 | Disposition: A | Discharge status: Home / Self Care | Payer: Medicare Other | Attending: Anesthesiology | Admitting: Anesthesiology

## 2023-09-14 DIAGNOSIS — Z01812 Encounter for preprocedural laboratory examination: Secondary | ICD-10-CM | POA: Insufficient documentation

## 2023-09-14 DIAGNOSIS — Z79899 Other long term (current) drug therapy: Secondary | ICD-10-CM | POA: Insufficient documentation

## 2023-09-14 LAB — BASIC METABOLIC PANEL
Anion gap: 8 (ref 5–15)
BUN: 18 mg/dL (ref 8–23)
CO2: 29 mmol/L (ref 22–32)
Calcium: 9.1 mg/dL (ref 8.9–10.3)
Chloride: 101 mmol/L (ref 98–111)
Creatinine, Ser: 0.76 mg/dL (ref 0.44–1.00)
GFR, Estimated: >60 mL/min (ref >60)
Glucose, Bld: 141 mg/dL — ABNORMAL HIGH (ref 70–99)
Potassium: 3.7 mmol/L (ref 3.5–5.1)
Sodium: 138 mmol/L (ref 135–145)

## 2023-09-21 NOTE — Discharge Instructions (Signed)
[facility]  POST OPERATIVE INSTRUCTIONS FOR DR. Ether Griffins AND DR. BAKER [facility] CLINIC PODIATRY DEPARTMENT   Take your medication as prescribed.  Pain medication should be taken only as needed.  Keep the dressing clean, dry and intact.  Keep your foot elevated above the heart level for the first 48 hours.  Walking to the bathroom and brief periods of walking are acceptable, unless we have instructed you to be non-weight bearing.  Always wear your post-op shoe when walking.  Always use your crutches if you are to be non-weight bearing.  Do not take a shower. Baths are permissible as long as the foot is kept out of the water.   Every hour you are awake:  Bend your knee 15 times. Flex foot 15 times Massage calf 15 times  Call [facility] [phone]) if any of the following problems occur: You develop a temperature or fever. The bandage becomes saturated with blood. Medication does not stop your pain. Injury of the foot occurs. Any symptoms of infection including redness, odor, or red streaks running from wound.

## 2023-09-22 ENCOUNTER — Ambulatory Visit: Payer: Medicare Other | Admitting: Anesthesiology

## 2023-09-22 ENCOUNTER — Other Ambulatory Visit: Payer: Self-pay

## 2023-09-22 ENCOUNTER — Ambulatory Visit
Admission: RE | Admit: 2023-09-22 | Discharge: 2023-09-22 | Disposition: A | Discharge status: Home / Self Care | Payer: Medicare Other | Attending: Podiatry | Admitting: Podiatry

## 2023-09-22 ENCOUNTER — Ambulatory Visit: Payer: Self-pay

## 2023-09-22 ENCOUNTER — Encounter: Payer: Self-pay | Admitting: Podiatry

## 2023-09-22 ENCOUNTER — Encounter
Admission: RE | Disposition: A | Discharge status: Home / Self Care | Payer: Self-pay | Source: Home / Self Care | Attending: Podiatry

## 2023-09-22 DIAGNOSIS — M65971 Unspecified synovitis and tenosynovitis, right ankle and foot: Secondary | ICD-10-CM | POA: Diagnosis not present

## 2023-09-22 DIAGNOSIS — M2011 Hallux valgus (acquired), right foot: Secondary | ICD-10-CM | POA: Insufficient documentation

## 2023-09-22 DIAGNOSIS — K219 Gastro-esophageal reflux disease without esophagitis: Secondary | ICD-10-CM | POA: Diagnosis not present

## 2023-09-22 DIAGNOSIS — M357 Hypermobility syndrome: Secondary | ICD-10-CM | POA: Insufficient documentation

## 2023-09-22 DIAGNOSIS — Z79899 Other long term (current) drug therapy: Secondary | ICD-10-CM | POA: Diagnosis not present

## 2023-09-22 DIAGNOSIS — I10 Essential (primary) hypertension: Secondary | ICD-10-CM | POA: Diagnosis not present

## 2023-09-22 DIAGNOSIS — Z01812 Encounter for preprocedural laboratory examination: Secondary | ICD-10-CM

## 2023-09-22 HISTORY — DX: Presence of dental prosthetic device (complete) (partial): Z97.2

## 2023-09-22 HISTORY — PX: BUNIONECTOMY: SHX129

## 2023-09-22 SURGERY — BUNIONECTOMY
Anesthesia: General | Site: Toe | Laterality: Right

## 2023-09-22 MED ORDER — BUPIVACAINE-EPINEPHRINE 0.25% -1:200000 IJ SOLN
INTRAMUSCULAR | Status: DC | PRN
  Administered 2023-09-22: 10 mL

## 2023-09-22 MED ORDER — 0.9 % SODIUM CHLORIDE (POUR BTL) OPTIME
TOPICAL | Status: DC | PRN
  Administered 2023-09-22: 500 mL

## 2023-09-22 MED ORDER — LIDOCAINE HCL (CARDIAC) PF 100 MG/5ML IV SOSY
PREFILLED_SYRINGE | INTRAVENOUS | Status: DC | PRN
  Administered 2023-09-22: 80 mg via INTRATRACHEAL

## 2023-09-22 MED ORDER — ASPIRIN 81 MG PO TBEC: 81.0000 mg | DELAYED_RELEASE_TABLET | Freq: Two times a day (BID) | ORAL | 0 refills | Status: DC

## 2023-09-22 MED ORDER — DEXAMETHASONE SODIUM PHOSPHATE 4 MG/ML IJ SOLN
INTRAMUSCULAR | Status: AC
  Filled 2023-09-22: qty 1

## 2023-09-22 MED ORDER — OXYCODONE HCL 5 MG PO TABS: 5.0000 mg | ORAL_TABLET | Freq: Once | ORAL | Status: DC | PRN

## 2023-09-22 MED ORDER — DEXAMETHASONE SODIUM PHOSPHATE 4 MG/ML IJ SOLN
INTRAMUSCULAR | Status: DC | PRN
  Administered 2023-09-22: 4 mg via INTRAVENOUS

## 2023-09-22 MED ORDER — OXYCODONE HCL 5 MG/5ML PO SOLN: 5.0000 mg | Freq: Once | ORAL | Status: DC | PRN

## 2023-09-22 MED ORDER — CEFAZOLIN SODIUM-DEXTROSE 2-4 GM/100ML-% IV SOLN
2.0000 g | INTRAVENOUS | Status: AC
  Administered 2023-09-22: 2 g via INTRAVENOUS

## 2023-09-22 MED ORDER — BUPIVACAINE LIPOSOME 1.3 % IJ SUSP
INTRAMUSCULAR | Status: DC | PRN
  Administered 2023-09-22 (×2): 10 mL via PERINEURAL

## 2023-09-22 MED ORDER — LACTATED RINGERS IV SOLN
INTRAVENOUS | Status: DC | PRN
Start: 2023-09-22 — End: 2023-09-22

## 2023-09-22 MED ORDER — ACETAMINOPHEN 10 MG/ML IV SOLN: 1000.0000 mg | Freq: Once | INTRAVENOUS | Status: DC | PRN

## 2023-09-22 MED ORDER — DROPERIDOL 2.5 MG/ML IJ SOLN: 0.6250 mg | Freq: Once | INTRAMUSCULAR | Status: DC | PRN

## 2023-09-22 MED ORDER — FENTANYL CITRATE PF 50 MCG/ML IJ SOSY: 25.0000 ug | PREFILLED_SYRINGE | INTRAMUSCULAR | Status: DC | PRN

## 2023-09-22 MED ORDER — MIDAZOLAM HCL 2 MG/2ML IJ SOLN
INTRAMUSCULAR | Status: AC
  Filled 2023-09-22: qty 2

## 2023-09-22 MED ORDER — PROPOFOL 10 MG/ML IV BOLUS
INTRAVENOUS | Status: AC
Start: 2023-09-22 — End: ?
  Filled 2023-09-22: qty 20

## 2023-09-22 MED ORDER — MIDAZOLAM HCL 5 MG/5ML IJ SOLN
INTRAMUSCULAR | Status: DC | PRN
  Administered 2023-09-22: 1 mg via INTRAVENOUS

## 2023-09-22 MED ORDER — OXYCODONE-ACETAMINOPHEN 5-325 MG PO TABS: 1.0000 | ORAL_TABLET | Freq: Four times a day (QID) | ORAL | 0 refills | Status: AC | PRN

## 2023-09-22 MED ORDER — FENTANYL CITRATE (PF) 100 MCG/2ML IJ SOLN
INTRAMUSCULAR | Status: AC
  Filled 2023-09-22: qty 2

## 2023-09-22 MED ORDER — ONDANSETRON HCL 4 MG/2ML IJ SOLN
INTRAMUSCULAR | Status: DC | PRN
  Administered 2023-09-22: 4 mg via INTRAVENOUS

## 2023-09-22 MED ORDER — BUPIVACAINE HCL (PF) 0.5 % IJ SOLN
INTRAMUSCULAR | Status: DC | PRN
  Administered 2023-09-22 (×3): 10 mL via PERINEURAL

## 2023-09-22 MED ORDER — EPHEDRINE SULFATE (PRESSORS) 50 MG/ML IJ SOLN
INTRAMUSCULAR | Status: DC | PRN
  Administered 2023-09-22 (×5): 5 mg via INTRAVENOUS

## 2023-09-22 MED ORDER — EPHEDRINE 5 MG/ML INJ
INTRAVENOUS | Status: AC
  Filled 2023-09-22: qty 5

## 2023-09-22 MED ORDER — DEXMEDETOMIDINE HCL IN NACL 200 MCG/50ML IV SOLN
INTRAVENOUS | Status: DC | PRN
  Administered 2023-09-22 (×5): 4 ug via INTRAVENOUS

## 2023-09-22 MED ORDER — BUPIVACAINE HCL (PF) 0.5 % IJ SOLN
INTRAMUSCULAR | Status: AC
  Filled 2023-09-22: qty 20

## 2023-09-22 MED ORDER — AMOXICILLIN-POT CLAVULANATE 875-125 MG PO TABS: 1.0000 | ORAL_TABLET | Freq: Two times a day (BID) | ORAL | 0 refills | Status: DC

## 2023-09-22 MED ORDER — SODIUM CHLORIDE 0.9% FLUSH: 10.0000 mL | Freq: Two times a day (BID) | INTRAVENOUS | Status: DC

## 2023-09-22 MED ORDER — BUPIVACAINE LIPOSOME 1.3 % IJ SUSP
INTRAMUSCULAR | Status: AC
  Filled 2023-09-22: qty 20

## 2023-09-22 MED ORDER — ONDANSETRON HCL 4 MG/2ML IJ SOLN
INTRAMUSCULAR | Status: AC
  Filled 2023-09-22: qty 2

## 2023-09-22 MED ORDER — LIDOCAINE HCL (PF) 2 % IJ SOLN
INTRAMUSCULAR | Status: AC
  Filled 2023-09-22: qty 5

## 2023-09-22 MED ORDER — PROPOFOL 10 MG/ML IV BOLUS
INTRAVENOUS | Status: DC | PRN
  Administered 2023-09-22: 130 mg via INTRAVENOUS

## 2023-09-22 MED ORDER — PHENYLEPHRINE HCL (PRESSORS) 10 MG/ML IV SOLN
INTRAVENOUS | Status: DC | PRN
Start: 2023-09-22 — End: 2023-09-22
  Administered 2023-09-22 (×2): 80 ug via INTRAVENOUS

## 2023-09-22 MED ORDER — FENTANYL CITRATE (PF) 100 MCG/2ML IJ SOLN
INTRAMUSCULAR | Status: DC | PRN
  Administered 2023-09-22 (×6): 25 ug via INTRAVENOUS

## 2023-09-22 MED ORDER — CEFAZOLIN SODIUM-DEXTROSE 2-3 GM-%(50ML) IV SOLR
INTRAVENOUS | Status: AC
  Filled 2023-09-22: qty 50

## 2023-09-22 MED ORDER — PHENYLEPHRINE 80 MCG/ML (10ML) SYRINGE FOR IV PUSH (FOR BLOOD PRESSURE SUPPORT)
PREFILLED_SYRINGE | INTRAVENOUS | Status: AC
  Filled 2023-09-22: qty 10

## 2023-09-22 MED ORDER — DEXMEDETOMIDINE HCL IN NACL 80 MCG/20ML IV SOLN
INTRAVENOUS | Status: AC
Start: 2023-09-22 — End: ?
  Filled 2023-09-22: qty 20

## 2023-09-22 SURGICAL SUPPLY — 37 items
BENZOIN TINCTURE PRP APPL 2/3 (GAUZE/BANDAGES/DRESSINGS) IMPLANT
BLADE SAW LAPIPLASTY 40X11 (BLADE) IMPLANT
BNDG ELASTIC 4X5.8 VLCR NS LF (GAUZE/BANDAGES/DRESSINGS) ×1 IMPLANT
BNDG ELASTIC 6X5.8 VLCR NS LF (GAUZE/BANDAGES/DRESSINGS) ×1 IMPLANT
BNDG ESMARCH 4X12 STRL LF (GAUZE/BANDAGES/DRESSINGS) ×1 IMPLANT
BNDG GAUZE DERMACEA FLUFF 4 (GAUZE/BANDAGES/DRESSINGS) ×1 IMPLANT
CANISTER SUCT 1200ML W/VALVE (MISCELLANEOUS) ×1 IMPLANT
COVER LIGHT HANDLE UNIVERSAL (MISCELLANEOUS) ×2 IMPLANT
CUFF TOURN SGL QUICK 18X4 (TOURNIQUET CUFF) IMPLANT
DRAPE FLUOR MINI C-ARM 54X84 (DRAPES) ×1 IMPLANT
DRAPE INCISE IOBAN 66X45 STRL (DRAPES) IMPLANT
DURAPREP 26ML APPLICATOR (WOUND CARE) ×1 IMPLANT
ELECT REM PT RETURN 9FT ADLT (ELECTROSURGICAL) ×1
ELECTRODE REM PT RTRN 9FT ADLT (ELECTROSURGICAL) ×1 IMPLANT
GAUZE SPONGE 4X4 12PLY STRL (GAUZE/BANDAGES/DRESSINGS) ×1 IMPLANT
GAUZE XEROFORM 1X8 LF (GAUZE/BANDAGES/DRESSINGS) ×1 IMPLANT
GLOVE BIOGEL PI IND STRL 7.5 (GLOVE) ×1 IMPLANT
GLOVE SURG SS PI 7.0 STRL IVOR (GLOVE) ×1 IMPLANT
GOWN STRL REUS W/ TWL LRG LVL3 (GOWN DISPOSABLE) ×2 IMPLANT
KIT TURNOVER KIT A (KITS) ×1 IMPLANT
LAPIPLASTY SYS 4A (Orthopedic Implant) ×1 IMPLANT
NS IRRIG 500ML POUR BTL (IV SOLUTION) ×1 IMPLANT
PACK EXTREMITY ARMC (MISCELLANEOUS) ×1 IMPLANT
PADDING CAST BLEND 4X4 NS (MISCELLANEOUS) ×3 IMPLANT
SCREW 2.7 HIGH PITCH LOCKING (Screw) IMPLANT
SCREW CANN TRANSVERSE 3.5X35 (Screw) IMPLANT
SPLINT CAST 1 STEP 4X30 (MISCELLANEOUS) ×1 IMPLANT
STOCKINETTE IMPERVIOUS LG (DRAPES) ×1 IMPLANT
STRIP CLOSURE SKIN 1/4X4 (GAUZE/BANDAGES/DRESSINGS) IMPLANT
SUCTION TUBE FRAZIER 10FR DISP (SUCTIONS) IMPLANT
SUT ETHILON 3-0 FS-10 30 BLK (SUTURE) ×1
SUT MNCRL 4-0 27XMFL (SUTURE) ×1
SUT VIC AB 3-0 SH 27X BRD (SUTURE) IMPLANT
SUT VIC AB 4-0 FS2 27 (SUTURE) IMPLANT
SUTURE EHLN 3-0 FS-10 30 BLK (SUTURE) IMPLANT
SUTURE MNCRL 4-0 27XMF (SUTURE) IMPLANT
SYSTEM LAPIPLASTY 4A (Orthopedic Implant) IMPLANT

## 2023-09-22 NOTE — Progress Notes (Signed)
Assisted Adams ANMD with right, popliteal block. Side rails up, monitors on throughout procedure. See vital signs in flow sheet. Tolerated Procedure well.

## 2023-09-22 NOTE — Transfer of Care (Signed)
Immediate Anesthesia Transfer of Care Note  Patient: Rose Roberts  Procedure(s) Performed: Dorena Dew TYPE (Right: Toe)  Patient Location: PACU  Anesthesia Type: General LMA  Level of Consciousness: awake, alert  and patient cooperative  Airway and Oxygen Therapy: Patient Spontanous Breathing and Patient connected to supplemental oxygen  Post-op Assessment: Post-op Vital signs reviewed, Patient's Cardiovascular Status Stable, Respiratory Function Stable, Patent Airway and No signs of Nausea or vomiting  Post-op Vital Signs: Reviewed and stable  Complications: No notable events documented.

## 2023-09-22 NOTE — Anesthesia Procedure Notes (Addendum)
Anesthesia Regional Block: Popliteal block   Pre-Anesthetic Checklist: , timeout performed,  Correct Patient, Correct Site, Correct Laterality,  Correct Procedure, Correct Position, site marked,  Risks and benefits discussed,  Surgical consent,  Pre-op evaluation,  At surgeon's request and post-op pain management  Laterality: Lower and Right  Prep: chloraprep       Needles:  Injection technique: Single-shot  Needle Type: Echogenic Needle     Needle Length: 9cm  Needle Gauge: 20     Additional Needles:   Procedures:,,,, ultrasound used (permanent image in chart),,    Narrative:  End time: 09/22/2023 11:15 AM Injection made incrementally with aspirations every 5 mL.  Performed by: Personally  Anesthesiologist: Yevette Edwards, MD  Additional Notes: Patient consented for risk and benefits of nerve block including but not limited to nerve damage, failed block, bleeding and infection.  Patient voiced understanding.  Functioning IV was confirmed and monitors were applied.  Timeout done prior to procedure and prior to any sedation being given to the patient.  Patient confirmed procedure site prior to any sedation given to the patient. Sterile prep,hand hygiene and sterile gloves were used.  Minimal sedation used for procedure.  No paresthesia endorsed by patient during the procedure.  Negative aspiration and negative test dose prior to incremental administration of local anesthetic. The patient tolerated the procedure well with no immediate complications.

## 2023-09-22 NOTE — H&P (Signed)
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  The various methods of treatment have been discussed with the patient.  No guarantees were given.  After consideration of risks, benefits and other options for treatment, the patient has consented to surgery.  I have reviewed the patients' chart and labs.    PROCEDURE: RIGHT LAPIDUS BUNIONECTOMY WITH POSSIBLE AKIN OSTEOTOMY  A history and physical examination was performed in my office.  The patient was reexamined.  There have been no changes to this history and physical examination.  Rosetta Posner, DPM

## 2023-09-22 NOTE — Anesthesia Procedure Notes (Signed)
Procedure Name: LMA Insertion Date/Time: 09/22/2023 7:38 AM  Performed by: Barbette Hair, CRNAPre-anesthesia Checklist: Patient identified, Emergency Drugs available, Suction available, Patient being monitored and Timeout performed Patient Re-evaluated:Patient Re-evaluated prior to induction Oxygen Delivery Method: Circle system utilized Preoxygenation: Pre-oxygenation with 100% oxygen Induction Type: IV induction LMA: LMA inserted LMA Size: 4.0 Tube type: Oral Number of attempts: 1 Placement Confirmation: positive ETCO2, CO2 detector and breath sounds checked- equal and bilateral Tube secured with: Tape Dental Injury: Teeth and Oropharynx as per pre-operative assessment

## 2023-09-22 NOTE — Anesthesia Procedure Notes (Addendum)
Anesthesia Regional Block: Adductor canal block   Pre-Anesthetic Checklist: , timeout performed,  Correct Patient, Correct Site, Correct Laterality,  Correct Procedure, Correct Position, site marked,  Risks and benefits discussed,  Surgical consent,  Pre-op evaluation,  At surgeon's request and post-op pain management  Laterality: Upper, Right and Lower  Prep: chloraprep       Needles:  Injection technique: Single-shot  Needle Type: Echogenic Stimulator Needle     Needle Length: 9cm  Needle Gauge: 20     Additional Needles:   Procedures:,,,, ultrasound used (permanent image in chart),,    Narrative:  End time: 09/22/2023 11:20 AM  Performed by: Personally  Anesthesiologist: Yevette Edwards, MD  Additional Notes: Pt. Identified and accepting of procedure after risks and benefits fully reviewed and questions answered. Time out performed and laterality confirmed prior to procedure.  Neg IVTD.  No pain on injection of Local anesthetic and VSST.

## 2023-09-22 NOTE — Anesthesia Postprocedure Evaluation (Signed)
Anesthesia Post Note  Patient: Rose Roberts  Procedure(s) Performed: Dorena Dew TYPE (Right: Toe)  Patient location during evaluation: PACU Anesthesia Type: General Level of consciousness: awake and alert Pain management: pain level controlled Vital Signs Assessment: post-procedure vital signs reviewed and stable Respiratory status: spontaneous breathing, nonlabored ventilation, respiratory function stable and patient connected to nasal cannula oxygen Cardiovascular status: blood pressure returned to baseline and stable Postop Assessment: no apparent nausea or vomiting Anesthetic complications: no   No notable events documented.   Last Vitals:  Vitals:   09/22/23 1105 09/22/23 1110  BP:    Pulse: 90 89  Resp: 17 19  Temp:    SpO2: 99% 98%    Last Pain:  Vitals:   09/22/23 1100  TempSrc:   PainSc: 0-No pain                 Yevette Edwards

## 2023-09-22 NOTE — Op Note (Signed)
PODIATRY / FOOT AND ANKLE SURGERY OPERATIVE REPORT    SURGEON: Rosetta Posner, DPM  PRE-OPERATIVE DIAGNOSIS:  1.  Hallux valgus right 2.  Hypermobile first ray right  POST-OPERATIVE DIAGNOSIS: Same  PROCEDURE(S): Right Lapidus bunionectomy  HEMOSTASIS: Right thigh tourniquet  ANESTHESIA: general  ESTIMATED BLOOD LOSS: 20 cc  FINDING(S): 1.  Large amount of synovitis present to the first metatarsal phalange joint and first tarsometatarsal joint with severe bunion deformity and hypermobility noted at the intercuneiform joint and first tarsometatarsal joint  PATHOLOGY/SPECIMEN(S): None  INDICATIONS:   Rose Roberts is a 72 y.o. female who presents with longstanding history of pain to the right first metatarsal phalangeal joint area in the area of a painful bunion.  She is exhausted conservative measures today and presents today for surgical intervention.  All treatment options were discussed with the patient of both conservative and surgical attempts at correction including potential risks and complications.  Patient has elected for procedure consisting of right Lapidus bunionectomy with possible Akin osteotomy.  No guarantees given.  Consent obtained.  DESCRIPTION: After obtaining full informed written consent, the patient was brought back to the operating room and placed supine upon the operating table.  The patient received IV antibiotics prior to induction.  After obtaining adequate anesthesia, the patient was prepped and draped in the standard fashion.  A preop block was performed with 10 cc of quarter percent Marcaine plain about the first ray.  An Esmarch bandage was used to exsanguinate the right lower extremity the pneumatic ankle tourniquet was inflated.  Attention was directed to the first tarsometatarsal joint dorsally where an incision was marked over the dorsal aspect of the joint and another incision was marked at the dorsal medial aspect of the first metatarsal phalange  joint.  The incisions were made over these areas and deepened through the subcutaneous tissues utilizing sharp and blunt dissection and care was taken to identify and retract all vital neural and vascular structures and all venous contributories were cauterized as necessary.  At the first tarsometatarsal joint level a capsular and periosteal incision was made medial to the tendon of the extensor hallucis longus.  The capsular and periosteal tissue was reflected medially and laterally thereby exposing the first tarsometatarsal joint at the operative site.  A large amount of synovitic type drainage was present within the joint that appeared to be yellow and clear.  This was flushed out the joint with copious amounts normal sterile saline.  Dissection was then continued distally through the second incision that was made at the first metatarsal phalangeal joint.  A capsular and periosteal incision was made medial to the tendon of the extensor hallucis longus involve the contour of the deformity.  The capsular and periosteal tissue was reflected medially and laterally up exposing the first metatarsal phalange joint at the operative site.  There appeared to be a large amount of synovitic drainage present within this joint as well which was flushed with copious amounts normal sterile saline.  There appeared to be minimal articular cartilage damage to both joints overall.  The sagittal bone saw was then used to resect a large portion of the medial eminence of the first metatarsal head and was passed off the operative site.  A small dorsal exostosis was also resected off the first metatarsal head with a sagittal bone saw.  The joint was inspected once again did not appear to have any articular cartilage damage.  Attention was then directed to the first interspace via the same  incision at the first metatarsal phalange joint level where the skin and subcutaneous tissue was retracted laterally and the extensor hallucis longus  tendon was retracted medially.  Blunt dissection was continued down to the lateral aspect of the joint.  At this time a lateral lease was performed releasing the collateral and suspensory ligaments as well as the conjoined tendon of the adductor hallucis.  Attention was then directed back to the first tarsometatarsal joint where an osteotome was placed into the joint and used to free up any adhesions or ligamentous attachments.  A sagittal bone saw was then placed into the joint to free up any adhesions or connections of the joint as well.  This appeared to mobilize the joint well.  The rotational wire was then placed into the first metatarsal base.  The fulcrum was then placed between the first and second metatarsals and held in place.  A small incision was made over the dorsal lateral aspect of the second metatarsal midshaft and bluntly dissected down to the area with a hemostat.  The joint reduction guide was then placed compressing the first and second metatarsals more closely together while holding the rotational wire in the appropriate position.  This appeared to correct the IM angle well and appeared to have good correction in the sesamoid position as well but still slightly rotated.  This was manipulated a few times but no further correction could be obtained.  Fluoroscopic imaging was used to verify positioning which appeared to be good overall.  The correction was then held in place with a wire through the reduction aid.  The joint seeker was then placed along with the cut guide.  Fluoroscopic imaging was used to verify correct position which appeared to be excellent.  At this time the cut guide was then pinned into place using manufactures protocol.  The joint seeker was then removed.  The sagittal bone saw was then used to resect portion of the articular cartilage of the distal aspect of the medial cuneiform and base of the first metatarsal.  The joint cut guide was then removed along with one of the  pins that was holding in place.  The resected surfaces were then removed and passed off the operative site.  A curette was used to remove any further articular cartilage that was present.  The joint was inspected further after the joint distractor was applied and further articular cartilage was resected with a curette and osteotome.  The joint was then flushed with copious amounts normal sterile saline.  This was inspected 1 more time and did not appear to have any cartilage present.  The joint surfaces were then fenestrated with a 2.0 drill bit.  The medial plate was then applied after the fulcrum and the joint reduction guide was removed along with wire holding reduction.  The joint compressor was then applied to compress the joint.  This appeared to compress the joint well but did have some slight medial overhang.  The medial overhang was resected after the joint was compressed making more normal contour to the medial first tarsometatarsal joint area.  Fluoroscopic imaging was used to verify corrected position which appeared to be good overall as the first intermetatarsal space angle appeared to be within normal limits and the sesamoids appear to be rotated further.  The threaded olive wire was then placed through the first metatarsal base across the first tarsometatarsal joint and into the medial cuneiform for temporary fixation.  The medial plate was then applied with corresponding  4 screws and were applied using standard AO principles and techniques.  Appeared to be the appropriate length and size under fluoroscopic guidance and appeared to be in good position.  The dorsal plate was then applied in similar fashion with 4 locking screws of the appropriate length and size.  The temporary olive wire fixation was then removed and passed off the operative site.  Stressing was then performed of the interval between the first and second metatarsals there appeared to be some slight gapping at the intermediate  cuneiform and medial cuneiform so a transverse screw was decided be placed.  Under fluoroscopic guidance a guidewire was used to create a hole for the screw from the medial cuneiform into the second cuneiform and this was checked under AP and lateral views.  Once it appeared to be appropriate length and in the appropriate position utilizing standard AO principles and techniques a transverse screw was placed across the medial cuneiform and into the second cuneiform with the appropriate size and length.  Excellent compression was noted.  Further stressing was then performed and no further instability was noted.  The surgical sites were flushed with copious amounts normal sterile saline.  Attention was directed to the first metatarsal phalangeal joint where a V-type capsulotomy was performed removing a small portion of the capsule at the medial aspect first metatarsal phalangeal joint area.  The toe was held in a rectus position and the Periosteal Tissues at This Level Reapproximated Well Coapted with 3-0 Vicryl.  This Appeared to Hold the Toe in a Rectus Position Overall.  No Need for an Quintella Reichert Was Present at This Time.  The Periosteal Tissue at the First Tarsometatarsal Joint Was Also Reapproximated Well Coapted with 3-0 Vicryl.  The Subcutaneous Tissue Was Reapproximated Well Coapted with 4-0 Vicryl at Both Procedure Sites and the Skin Was Then Reapproximated Well Coapted at the Tarsometatarsal Joint and First Metatarsal Phalangeal Joint with 4-0 Monocryl in a Running Stitch.  The Small Percutaneous Incision Made over the Dorsal Lateral Aspect of the Second Metatarsal Was Reapproximated Well Coapted with 3-0 Nylon in Horizontal Mattress Type Stitching.  The pneumatic ankle tourniquet was deflated and prompt hyperemic response was noted to all digits of the right foot.  A postoperative dressing was then applied consisting of benzoin and Steri-Strips, Xeroform, 4 x 4 gauze, Kerlix, Webril, posterior splint, Ace  wrap.  The patient tolerated the procedure and anesthesia well and was transferred to the recovery room vital signs stable vascular status intact all toes the right foot.  Following a period of postoperative monitoring the patient be discharged home with the appropriate orders, medications, and instructions.  Patient is to remain nonweightbearing at all times the right lower extremity.  Patient to follow-up in 1 week for further evaluation.  COMPLICATIONS: None  CONDITION: Good, stable  Rosetta Posner, DPM

## 2023-09-22 NOTE — Anesthesia Preprocedure Evaluation (Signed)
Anesthesia Evaluation  Patient identified by MRN, date of birth, ID band Patient awake    Reviewed: Allergy & Precautions, H&P , NPO status , Patient's Chart, lab work & pertinent test results, reviewed documented beta blocker date and time   Airway Mallampati: II  TM Distance: >3 FB Neck ROM: full    Dental  (+) Teeth Intact   Pulmonary neg pulmonary ROS   Pulmonary exam normal        Cardiovascular Exercise Tolerance: Poor hypertension, On Medications negative cardio ROS Normal cardiovascular exam Rate:Normal     Neuro/Psych negative neurological ROS  negative psych ROS   GI/Hepatic Neg liver ROS,GERD  Medicated,,  Endo/Other  negative endocrine ROS    Renal/GU negative Renal ROS  negative genitourinary   Musculoskeletal   Abdominal   Peds  Hematology negative hematology ROS (+)   Anesthesia Other Findings   Reproductive/Obstetrics negative OB ROS                             Anesthesia Physical Anesthesia Plan  ASA: 2  Anesthesia Plan: General LMA   Post-op Pain Management: Regional block   Induction:   PONV Risk Score and Plan: 4 or greater  Airway Management Planned:   Additional Equipment:   Intra-op Plan:   Post-operative Plan:   Informed Consent: I have reviewed the patients History and Physical, chart, labs and discussed the procedure including the risks, benefits and alternatives for the proposed anesthesia with the patient or authorized representative who has indicated his/her understanding and acceptance.       Plan Discussed with: CRNA  Anesthesia Plan Comments:        Anesthesia Quick Evaluation

## 2023-09-26 ENCOUNTER — Encounter: Payer: Self-pay | Admitting: Podiatry

## 2024-03-18 DIAGNOSIS — M67432 Ganglion, left wrist: Secondary | ICD-10-CM

## 2024-03-18 HISTORY — DX: Ganglion, left wrist: M67.432

## 2024-03-20 ENCOUNTER — Other Ambulatory Visit: Payer: Self-pay | Admitting: Surgery

## 2024-03-27 ENCOUNTER — Encounter
Admission: RE | Admit: 2024-03-27 | Discharge: 2024-03-27 | Disposition: A | Discharge status: Home / Self Care | Source: Ambulatory Visit | Attending: Surgery | Admitting: Surgery

## 2024-03-27 ENCOUNTER — Other Ambulatory Visit: Payer: Self-pay

## 2024-03-27 VITALS — Ht 60.0 in | Wt 210.0 lb

## 2024-03-27 DIAGNOSIS — Z01810 Encounter for preprocedural cardiovascular examination: Secondary | ICD-10-CM

## 2024-03-27 DIAGNOSIS — I10 Essential (primary) hypertension: Secondary | ICD-10-CM

## 2024-03-27 DIAGNOSIS — Z01812 Encounter for preprocedural laboratory examination: Secondary | ICD-10-CM

## 2024-03-27 HISTORY — DX: Morbid (severe) obesity due to excess calories: E66.01

## 2024-03-27 HISTORY — DX: Body Mass Index (BMI) 40.0 and over, adult: Z684

## 2024-03-27 HISTORY — DX: Carpal tunnel syndrome, bilateral upper limbs: G56.03

## 2024-03-27 HISTORY — DX: Bilateral primary osteoarthritis of knee: M17.0

## 2024-03-27 NOTE — Patient Instructions (Addendum)
 Su procedimiento est programado para el mircoles 16 de junio. Presntese en el mostrador de registro, ubicado en Information systems manager del [employer] Para conocer su hora de llegada, llame al [phone] entre la 1 p. m. y las 3 p. m. el martes 15 de junio. Si su hora de llegada es a las 6:00 a. m., no llegue antes, ya que las puertas de [place] del [address] no abren [employer] 6:00 a. m.  RECUERDE: El incumplimiento de las instrucciones puede resultar en un riesgo mdico grave, que puede incluir la [place]; o, a discrecin de su cirujano y Scientific laboratory technician, su ciruga podra tener que reprogramarse.  No consuma alimentos despus de la medianoche anterior a la ciruga.  No mastique chicle ni caramelos duros.  Sin embargo, puede beber lquidos claros hasta 2 horas antes de su hora de llegada programada para la [place]. No beba nada dentro de las 2 horas previas a su hora de llegada programada.  Los lquidos claros incluyen: - Agua - Jugo de manzana sin pulpa - Gatorade (sin colorantes ROJOS) - Caf o t negro (NO agregue leche ni cremas al caf o t). NO beba nada que no est en esta lista.  Adems, su mdico le ha indicado que tome la bebida preoperatoria de carbohidratos Ensure Clear Carbohidratos. Beber esta bebida de carbohidratos [employer] horas antes de la [place] a reducir la resistencia a la insulina y a [employer] del [place]. Termine de beberla 2 horas antes de su hora de llegada programada.  Una semana antes de la ciruga: a Medora Spiller del 11 de junio Suspenda los antiinflamatorios (AINE) como Advil, Aleve, ibuprofeno, Motrin, naproxeno, Naprosyn y productos a base de aspirina como Excedrin, Goody's Powder y BC Powder. Suspenda cualquier suplemento de venta libre hasta despus de la ciruga.  Sin embargo, puede Educational psychologist tomando Tylenol  si lo necesita para Marketing executive de la [place].  Contine tomando todos sus dems medicamentos recetados hasta el da  de la ciruga.  EL DA DE LA CIRUGA NO TOME NINGN MEDICAMENTO  No consuma alcohol durante las 24 horas previas ni posteriores a la [place].  No fume, incluidos los cigarrillos electrnicos, durante las 24 horas previas a la [place].  No consuma tabaco masticable durante al menos 6 horas antes de la [place].  No use parches de nicotina el da de la [place].  No consuma drogas recreativas durante al menos una semana (preferiblemente 2 semanas) antes de la ciruga.  Tenga en cuenta que la combinacin de cocana y anestesia puede tener consecuencias negativas, incluso la [place]. Si da positivo en la prueba de cocana, se cancelar la ciruga.  La maana de la ciruga, cepllese los dientes con pasta dental y agua; puede enjuagarse la boca con enjuague bucal si lo desea. No ingiera pasta dental ni enjuague bucal.  Use jabn CHG segn las instrucciones.  No use joyas, maquillaje, horquillas, broches ni esmalte de uas.  Para joyera soldada (permanente): pulseras, tobilleras, fajas, etc., qutesela antes de la [place]. Si no se la [place], es posible que el personal del hospital tenga que cortarla el da de la [place].  No use lociones, polvos ni perfumes.  No se afeite el vello corporal del cuello para abajo 48 horas antes de la [place].  No se permite el uso de lentes de contacto, audfonos ni dentaduras postizas durante la [place].  No traiga objetos de valor al hospital. [facility] no se hace responsable de la prdida de pertenencias o  objetos de valor.  Notifique a su mdico si hay algn cambio en su condicin mdica (resfriado, fiebre, infeccin).  Lleve ropa cmoda (especfica para su tipo de [place]) al hospital.  Despus de la [place], puede ayudar a prevenir complicaciones pulmonares haciendo ejercicios de respiracin. Respire profundamente y tosa cada 1 o 2 horas. Su mdico podra solicitarle un dispositivo llamado espirmetro incentivador para ayudarle a respirar  profundamente. Al toser o estornudar, sostenga una almohada firmemente contra la incisin con ambas manos. Esto se llama "entablillar". Hacerlo ayuda a proteger la incisin. Tambin disminuye las molestias abdominales.  Si le dan de alta el da de la ciruga, no se le permitir conducir a casa. Necesitar que una persona responsable lo lleve a casa y lo acompae durante 24 horas despus de la [place].  Si usa  transporte pblico, necesitar una persona responsable con usted.  Si tiene [employer], llame al [place] [employer] de Preadmisin al [phone].  Poltica de Visitas a Cirugas:  Los [employer] se sometan a [place] o procedimiento pueden [employer] visitas.  Los [employer] de 16 aos deben estar acompaados por un adulto que no sea [place].    Preparacin para la ciruga con jabn de GLUCONATO DE CLORHEXIDINA (CHG)  Jabn de gluconato de clorhexidina (CHG)  * Un limpiador antisptico que [employer] grmenes y se adhiere a la piel para seguir [employer] grmenes incluso despus del lavado.   *Se utiliza para ducharse la noche anterior a la [place] y la maana de la [place].  Antes de la [place], usted puede desempear un papel importante al reducir la cantidad de grmenes en su piel. El jabn CHG (gluconato de clorhexidina) es un limpiador antisptico que mata los grmenes y se adhiere a la piel para continuar matndolos incluso despus del lavado.  No lo utilice si es alrgico al CHG o a los jabones antibacterianos. Si su piel se enrojece o irrita, deje de usar CHG.  1. Ducharse la NOCHE ANTES DE LA CIRUGA y la [place] DE LA CIRUGA con jabn CHG.  2. Si eliges lavarte el cabello, lvalo primero como de costumbre con tu champ habitual.  3. Despus del champ, enjuague bien el cabello y el cuerpo para eliminar el champ.  4. Utilice CHG como lo hara con cualquier otro jabn lquido. Puede aplicar CHG directamente sobre la piel  y lavar suavemente con un pauelo o una toallita limpia.  5. Aplique el jabn CHG en su cuerpo nicamente desde el cuello hacia abajo. No utilizar en heridas abiertas o llagas abiertas. Evite el contacto con los ojos, odos, boca y genitales (partes privadas). Lvese la cara y los genitales (partes privadas) con su jabn habitual.  6. Lvese bien, prestando especial atencin al rea donde se realizar su ciruga.  7. Enjuague bien su cuerpo con agua tibia.  8. No se duche ni se lave con su jabn normal despus de usar y enjuagar el jabn CHG.  9. Squese dando palmaditas con una toalla limpia.  10. Use pijamas limpios para dormir la noche anterior a la ciruga.  12. Coloque sbanas limpias en su cama la noche de su primera ducha y no duerma con mascotas.  13. Ducharse nuevamente con el jabn CHG el da de la ciruga antes de llegar al hospital.  14. No aplique desodorantes, lociones o polvos.  15. Por favor use ropa limpia al hospital.     ENGLISH VERSION:  Your procedure is scheduled on: Wednesday, June 18 Report  to the Registration Desk on the 1st floor of the [facility]. To find out your arrival time, please call [phone] between 1PM - 3PM on: Tuesday, June 17 If your arrival time is 6:00 am, do not arrive before that time as the [facility] entrance doors do not open until 6:00 am.  REMEMBER: Instructions that are not followed completely may result in serious medical risk, up to and including death; or upon the discretion of your surgeon and anesthesiologist your surgery may need to be rescheduled.  Do not eat food after midnight the night before surgery.  No gum chewing or hard candies.  You may however, drink CLEAR liquids up to 2 hours before you are scheduled to arrive for your surgery. Do not drink anything within 2 hours of your scheduled arrival time.  Clear liquids include: - water  - apple juice without pulp - gatorade (not RED colors) - black coffee  or tea (Do NOT add milk or creamers to the coffee or tea) Do NOT drink anything that is not on this list.  In addition, your doctor has ordered for you to drink the provided:  Ensure Pre-Surgery Clear Carbohydrate Drink  Drinking this carbohydrate drink up to two hours before surgery helps to reduce insulin resistance and improve patient outcomes. Please complete drinking 2 hours before scheduled arrival time.  One week prior to surgery: starting June 11 Stop Anti-inflammatories (NSAIDS) such as Advil, Aleve, Ibuprofen, Motrin, Naproxen, Naprosyn and Aspirin  based products such as Excedrin, Goody's Powder, BC Powder. Stop ANY OVER THE COUNTER supplements until after surgery.  You may however, continue to take Tylenol  if needed for pain up until the day of surgery.  Continue taking all of your other prescription medications up until the day of surgery.  ON THE DAY OF SURGERY DO NOT TAKE ANY MEDICATIONS   No Alcohol for 24 hours before or after surgery.  No Smoking including e-cigarettes for 24 hours before surgery.  No chewable tobacco products for at least 6 hours before surgery.  No nicotine patches on the day of surgery.  Do not use any "recreational" drugs for at least a week (preferably 2 weeks) before your surgery.  Please be advised that the combination of cocaine and anesthesia may have negative outcomes, up to and including death. If you test positive for cocaine, your surgery will be cancelled.  On the morning of surgery brush your teeth with toothpaste and water, you may rinse your mouth with mouthwash if you wish. Do not swallow any toothpaste or mouthwash.  Use CHG Soap as directed on instruction sheet.  Do not wear jewelry, make-up, hairpins, clips or nail polish.  For welded (permanent) jewelry: bracelets, anklets, waist bands, etc.  Please have this removed prior to surgery.  If it is not removed, there is a chance that hospital personnel will need to cut it off on  the day of surgery.  Do not wear lotions, powders, or perfumes.   Do not shave body hair from the neck down 48 hours before surgery.  Contact lenses, hearing aids and dentures may not be worn into surgery.  Do not bring valuables to the hospital. [facility] is not responsible for any missing/lost belongings or valuables.   Notify your doctor if there is any change in your medical condition (cold, fever, infection).  Wear comfortable clothing (specific to your surgery type) to the hospital.  After surgery, you can help prevent lung complications by doing breathing exercises.  Take deep  breaths and cough every 1-2 hours. Your doctor may order a device called an Incentive Spirometer to help you take deep breaths. When coughing or sneezing, hold a pillow firmly against your incision with both hands. This is called "splinting." Doing this helps protect your incision. It also decreases belly discomfort.  If you are being discharged the day of surgery, you will not be allowed to drive home. You will need a responsible individual to drive you home and stay with you for 24 hours after surgery.   If you are taking public transportation, you will need to have a responsible individual with you.  Please call the Pre-admissions Testing Dept. at [phone] if you have any questions about these instructions.  Surgery Visitation Policy:  Patients having surgery or a procedure may have two visitors.  Children under the age of 27 must have an adult with them who is not the patient.      Preparing for Surgery with CHLORHEXIDINE  GLUCONATE (CHG) Soap  Chlorhexidine  Gluconate (CHG) Soap  o An antiseptic cleaner that kills germs and bonds with the skin to continue killing germs even after washing  o Used for showering the night before surgery and morning of surgery  Before surgery, you can play an important role by reducing the number of germs on your skin.  CHG (Chlorhexidine  gluconate) soap  is an antiseptic cleanser which kills germs and bonds with the skin to continue killing germs even after washing.  Please do not use if you have an allergy to CHG or antibacterial soaps. If your skin becomes reddened/irritated stop using the CHG.  1. Shower the NIGHT BEFORE SURGERY and the MORNING OF SURGERY with CHG soap.  2. If you choose to wash your hair, wash your hair first as usual with your normal shampoo.  3. After shampooing, rinse your hair and body thoroughly to remove the shampoo.  4. Use CHG as you would any other liquid soap. You can apply CHG directly to the skin and wash gently with a scrungie or a clean washcloth.  5. Apply the CHG soap to your body only from the neck down. Do not use on open wounds or open sores. Avoid contact with your eyes, ears, mouth, and genitals (private parts). Wash face and genitals (private parts) with your normal soap.  6. Wash thoroughly, paying special attention to the area where your surgery will be performed.  7. Thoroughly rinse your body with warm water.  8. Do not shower/wash with your normal soap after using and rinsing off the CHG soap.  9. Pat yourself dry with a clean towel.  10. Wear clean pajamas to bed the night before surgery.  12. Place clean sheets on your bed the night of your first shower and do not sleep with pets.  13. Shower again with the CHG soap on the day of surgery prior to arriving at the hospital.  14. Do not apply any deodorants/lotions/powders.  15. Please wear clean clothes to the hospital.

## 2024-03-28 ENCOUNTER — Encounter
Admission: RE | Admit: 2024-03-28 | Discharge: 2024-03-28 | Disposition: A | Discharge status: Home / Self Care | Source: Ambulatory Visit | Attending: Surgery | Admitting: Surgery

## 2024-03-28 ENCOUNTER — Encounter: Payer: Self-pay | Admitting: Urgent Care

## 2024-03-28 DIAGNOSIS — Z01818 Encounter for other preprocedural examination: Secondary | ICD-10-CM | POA: Diagnosis present

## 2024-03-28 DIAGNOSIS — Z01810 Encounter for preprocedural cardiovascular examination: Secondary | ICD-10-CM | POA: Diagnosis not present

## 2024-03-28 DIAGNOSIS — I10 Essential (primary) hypertension: Secondary | ICD-10-CM | POA: Diagnosis not present

## 2024-03-28 DIAGNOSIS — R9431 Abnormal electrocardiogram [ECG] [EKG]: Secondary | ICD-10-CM | POA: Insufficient documentation

## 2024-03-28 DIAGNOSIS — Z01812 Encounter for preprocedural laboratory examination: Secondary | ICD-10-CM

## 2024-03-28 LAB — CBC
HCT: 44.8 % (ref 36.0–46.0)
Hemoglobin: 14.4 g/dL (ref 12.0–15.0)
MCH: 26.8 pg (ref 26.0–34.0)
MCHC: 32.1 g/dL (ref 30.0–36.0)
MCV: 83.4 fL (ref 80.0–100.0)
Platelets: 306 10*3/uL (ref 150–400)
RBC: 5.37 MIL/uL — ABNORMAL HIGH (ref 3.87–5.11)
RDW: 15 % (ref 11.5–15.5)
WBC: 11.2 10*3/uL — ABNORMAL HIGH (ref 4.0–10.5)
nRBC: 0 % (ref 0.0–0.2)

## 2024-03-28 LAB — BASIC METABOLIC PANEL WITH GFR
Anion gap: 9 (ref 5–15)
BUN: 12 mg/dL (ref 8–23)
CO2: 28 mmol/L (ref 22–32)
Calcium: 9.5 mg/dL (ref 8.9–10.3)
Chloride: 105 mmol/L (ref 98–111)
Creatinine, Ser: 0.62 mg/dL (ref 0.44–1.00)
GFR, Estimated: >60 mL/min (ref >60)
Glucose, Bld: 114 mg/dL — ABNORMAL HIGH (ref 70–99)
Potassium: 3.8 mmol/L (ref 3.5–5.1)
Sodium: 142 mmol/L (ref 135–145)

## 2024-04-04 ENCOUNTER — Encounter: Payer: Self-pay | Admitting: Surgery

## 2024-04-04 ENCOUNTER — Ambulatory Visit: Admitting: Anesthesiology

## 2024-04-04 ENCOUNTER — Ambulatory Visit: Admission: RE | Admit: 2024-04-04 | Source: Home / Self Care | Admitting: Surgery

## 2024-04-04 ENCOUNTER — Encounter: Admission: RE | Payer: Self-pay | Source: Home / Self Care

## 2024-04-04 ENCOUNTER — Encounter
Admission: RE | Disposition: A | Discharge status: Home / Self Care | Payer: Self-pay | Source: Home / Self Care | Attending: Surgery

## 2024-04-04 ENCOUNTER — Other Ambulatory Visit: Payer: Self-pay

## 2024-04-04 ENCOUNTER — Ambulatory Visit
Admission: RE | Admit: 2024-04-04 | Discharge: 2024-04-04 | Disposition: A | Discharge status: Home / Self Care | Attending: Surgery | Admitting: Surgery

## 2024-04-04 DIAGNOSIS — E66813 Obesity, class 3: Secondary | ICD-10-CM | POA: Insufficient documentation

## 2024-04-04 DIAGNOSIS — I10 Essential (primary) hypertension: Secondary | ICD-10-CM | POA: Insufficient documentation

## 2024-04-04 DIAGNOSIS — Z6837 Body mass index (BMI) 37.0-37.9, adult: Secondary | ICD-10-CM | POA: Diagnosis not present

## 2024-04-04 DIAGNOSIS — M67432 Ganglion, left wrist: Secondary | ICD-10-CM | POA: Insufficient documentation

## 2024-04-04 HISTORY — PX: GANGLION CYST EXCISION: SHX1691

## 2024-04-04 SURGERY — EXCISION, GANGLION CYST, WRIST
Anesthesia: Choice | Site: Wrist | Laterality: Left

## 2024-04-04 SURGERY — EXCISION, GANGLION CYST, WRIST
Anesthesia: General | Site: Wrist | Laterality: Left

## 2024-04-04 MED ORDER — ACETAMINOPHEN 10 MG/ML IV SOLN: 1000.0000 mg | Freq: Once | INTRAVENOUS | Status: DC | PRN

## 2024-04-04 MED ORDER — DEXAMETHASONE SODIUM PHOSPHATE 10 MG/ML IJ SOLN
INTRAMUSCULAR | Status: DC | PRN
  Administered 2024-04-04: 10 mg via INTRAVENOUS

## 2024-04-04 MED ORDER — DEXAMETHASONE SODIUM PHOSPHATE 10 MG/ML IJ SOLN
INTRAMUSCULAR | Status: AC
  Filled 2024-04-04: qty 1

## 2024-04-04 MED ORDER — BUPIVACAINE HCL (PF) 0.5 % IJ SOLN
INTRAMUSCULAR | Status: DC | PRN
  Administered 2024-04-04: 5 mL

## 2024-04-04 MED ORDER — LACTATED RINGERS IV SOLN: INTRAVENOUS | Status: DC

## 2024-04-04 MED ORDER — KETOROLAC TROMETHAMINE 30 MG/ML IJ SOLN
INTRAMUSCULAR | Status: DC | PRN
  Administered 2024-04-04: 30 mg via INTRAVENOUS

## 2024-04-04 MED ORDER — KETOROLAC TROMETHAMINE 30 MG/ML IJ SOLN
INTRAMUSCULAR | Status: AC
  Filled 2024-04-04: qty 1

## 2024-04-04 MED ORDER — CHLORHEXIDINE GLUCONATE 0.12 % MT SOLN
OROMUCOSAL | Status: AC
  Filled 2024-04-04: qty 15

## 2024-04-04 MED ORDER — FENTANYL CITRATE (PF) 100 MCG/2ML IJ SOLN
INTRAMUSCULAR | Status: DC | PRN
  Administered 2024-04-04 (×2): 50 ug via INTRAVENOUS

## 2024-04-04 MED ORDER — FENTANYL CITRATE (PF) 100 MCG/2ML IJ SOLN
INTRAMUSCULAR | Status: AC
  Filled 2024-04-04: qty 2

## 2024-04-04 MED ORDER — CHLORHEXIDINE GLUCONATE 0.12 % MT SOLN
15.0000 mL | Freq: Once | OROMUCOSAL | Status: AC
Start: 2024-04-04 — End: 2024-04-04
  Administered 2024-04-04: 15 mL via OROMUCOSAL

## 2024-04-04 MED ORDER — FENTANYL CITRATE (PF) 100 MCG/2ML IJ SOLN: 25.0000 ug | INTRAMUSCULAR | Status: DC | PRN

## 2024-04-04 MED ORDER — ONDANSETRON HCL 4 MG/2ML IJ SOLN
INTRAMUSCULAR | Status: DC | PRN
  Administered 2024-04-04: 4 mg via INTRAVENOUS

## 2024-04-04 MED ORDER — OXYCODONE HCL 5 MG/5ML PO SOLN: 5.0000 mg | Freq: Once | ORAL | Status: DC | PRN

## 2024-04-04 MED ORDER — CEFAZOLIN SODIUM-DEXTROSE 2-4 GM/100ML-% IV SOLN
2.0000 g | INTRAVENOUS | Status: AC
  Administered 2024-04-04: 2 g via INTRAVENOUS

## 2024-04-04 MED ORDER — OXYCODONE HCL 5 MG PO TABS: 5.0000 mg | ORAL_TABLET | Freq: Once | ORAL | Status: DC | PRN

## 2024-04-04 MED ORDER — LIDOCAINE HCL (PF) 2 % IJ SOLN
INTRAMUSCULAR | Status: AC
  Filled 2024-04-04: qty 5

## 2024-04-04 MED ORDER — ONDANSETRON HCL 4 MG/2ML IJ SOLN
INTRAMUSCULAR | Status: AC
  Filled 2024-04-04: qty 2

## 2024-04-04 MED ORDER — CEFAZOLIN SODIUM-DEXTROSE 2-4 GM/100ML-% IV SOLN
INTRAVENOUS | Status: AC
  Filled 2024-04-04: qty 100

## 2024-04-04 MED ORDER — EPHEDRINE 5 MG/ML INJ
INTRAVENOUS | Status: AC
  Filled 2024-04-04: qty 5

## 2024-04-04 MED ORDER — LIDOCAINE HCL (CARDIAC) PF 100 MG/5ML IV SOSY
PREFILLED_SYRINGE | INTRAVENOUS | Status: DC | PRN
  Administered 2024-04-04: 100 mg via INTRAVENOUS

## 2024-04-04 MED ORDER — ORAL CARE MOUTH RINSE
15.0000 mL | Freq: Once | OROMUCOSAL | Status: AC
Start: 2024-04-04 — End: 2024-04-04

## 2024-04-04 MED ORDER — EPHEDRINE SULFATE-NACL 50-0.9 MG/10ML-% IV SOSY
PREFILLED_SYRINGE | INTRAVENOUS | Status: DC | PRN
Start: 2024-04-04 — End: 2024-04-04
  Administered 2024-04-04: 10 mg via INTRAVENOUS

## 2024-04-04 MED ORDER — PROPOFOL 10 MG/ML IV BOLUS
INTRAVENOUS | Status: AC
  Filled 2024-04-04: qty 20

## 2024-04-04 MED ORDER — BUPIVACAINE HCL (PF) 0.5 % IJ SOLN
INTRAMUSCULAR | Status: AC
  Filled 2024-04-04: qty 30

## 2024-04-04 MED ORDER — ONDANSETRON HCL 4 MG/2ML IJ SOLN: 4.0000 mg | Freq: Once | INTRAMUSCULAR | Status: DC | PRN

## 2024-04-04 MED ORDER — 0.9 % SODIUM CHLORIDE (POUR BTL) OPTIME
TOPICAL | Status: DC | PRN
  Administered 2024-04-04: 500 mL

## 2024-04-04 MED ORDER — PROPOFOL 10 MG/ML IV BOLUS
INTRAVENOUS | Status: DC | PRN
  Administered 2024-04-04: 120 mg via INTRAVENOUS

## 2024-04-04 SURGICAL SUPPLY — 27 items
BENZOIN TINCTURE PRP APPL 2/3 (GAUZE/BANDAGES/DRESSINGS) IMPLANT
BNDG COHESIVE 4X5 TAN STRL LF (GAUZE/BANDAGES/DRESSINGS) ×1 IMPLANT
BNDG ELASTIC 2INX 5YD STR LF (GAUZE/BANDAGES/DRESSINGS) ×1 IMPLANT
BNDG ESMARCH 4X12 STRL LF (GAUZE/BANDAGES/DRESSINGS) ×1 IMPLANT
CHLORAPREP W/TINT 26 (MISCELLANEOUS) ×2 IMPLANT
CORD BIP STRL DISP 12FT (MISCELLANEOUS) ×1 IMPLANT
CUFF TOURN SGL QUICK 18X4 (TOURNIQUET CUFF) IMPLANT
FORCEPS JEWEL BIP 4-3/4 STR (INSTRUMENTS) ×1 IMPLANT
GAUZE SPONGE 4X4 12PLY STRL (GAUZE/BANDAGES/DRESSINGS) ×1 IMPLANT
GAUZE XEROFORM 1X8 LF (GAUZE/BANDAGES/DRESSINGS) ×1 IMPLANT
GLOVE BIO SURGEON STRL SZ8 (GLOVE) ×1 IMPLANT
GLOVE INDICATOR 8.0 STRL GRN (GLOVE) ×1 IMPLANT
GOWN STRL REUS W/ TWL XL LVL3 (GOWN DISPOSABLE) ×1 IMPLANT
KIT TURNOVER KIT A (KITS) ×1 IMPLANT
MANIFOLD NEPTUNE II (INSTRUMENTS) ×1 IMPLANT
NS IRRIG 500ML POUR BTL (IV SOLUTION) ×1 IMPLANT
PACK EXTREMITY ARMC (MISCELLANEOUS) ×1 IMPLANT
PENCIL SMOKE EVACUATOR (MISCELLANEOUS) ×1 IMPLANT
SPLINT WRIST LG LT TX990309 (SOFTGOODS) IMPLANT
SPLINT WRIST LG RT TX900304 (SOFTGOODS) IMPLANT
SPLINT WRIST M LT TX990308 (SOFTGOODS) IMPLANT
SPLINT WRIST M RT TX990303 (SOFTGOODS) IMPLANT
STOCKINETTE IMPERVIOUS 9X36 MD (GAUZE/BANDAGES/DRESSINGS) ×1 IMPLANT
STRIP CLOSURE SKIN 1/4X4 (GAUZE/BANDAGES/DRESSINGS) IMPLANT
SUT PROLENE 4 0 PS 2 18 (SUTURE) ×1 IMPLANT
TRAP FLUID SMOKE EVACUATOR (MISCELLANEOUS) ×1 IMPLANT
WATER STERILE IRR 500ML POUR (IV SOLUTION) ×1 IMPLANT

## 2024-04-04 NOTE — Discharge Instructions (Addendum)
 Orthopedic discharge instructions: Keep dressing dry and intact. Keep hand elevated above heart level. May shower after dressing removed on postop day 4 (Sunday). Cover sutures with Band-Aids after drying off, then reapply Velcro splint. Apply ice to affected area frequently. Take ibuprofen 600 mg TID with meals for 3-5 days, then as necessary. Take ES Tylenol  if needed.  Return for follow-up in 10-14 days or as scheduled.

## 2024-04-04 NOTE — Anesthesia Procedure Notes (Signed)
 Procedure Name: LMA Insertion Date/Time: 04/04/2024 12:52 PM  Performed by: Tera Fellows, CRNAPre-anesthesia Checklist: Patient identified, Emergency Drugs available, Suction available and Patient being monitored Patient Re-evaluated:Patient Re-evaluated prior to induction Oxygen Delivery Method: Circle system utilized Preoxygenation: Pre-oxygenation with 100% oxygen Induction Type: IV induction Ventilation: Mask ventilation without difficulty LMA: LMA inserted LMA Size: 4.0 Number of attempts: 1 Placement Confirmation: positive ETCO2 and breath sounds checked- equal and bilateral Tube secured with: Tape Dental Injury: Teeth and Oropharynx as per pre-operative assessment

## 2024-04-04 NOTE — Anesthesia Preprocedure Evaluation (Signed)
 Anesthesia Evaluation  Patient identified by MRN, date of birth, ID band Patient awake    Reviewed: Allergy & Precautions, NPO status , Patient's Chart, lab work & pertinent test results  History of Anesthesia Complications Negative for: history of anesthetic complications  Airway Mallampati: II  TM Distance: >3 FB Neck ROM: Full    Dental  (+) Upper Dentures, Lower Dentures   Pulmonary neg pulmonary ROS, neg sleep apnea, neg COPD, Patient abstained from smoking.Not current smoker   Pulmonary exam normal breath sounds clear to auscultation       Cardiovascular Exercise Tolerance: Good METShypertension, Pt. on medications (-) CAD and (-) Past MI (-) dysrhythmias  Rhythm:Regular Rate:Normal - Systolic murmurs    Neuro/Psych negative neurological ROS  negative psych ROS   GI/Hepatic ,GERD  Controlled,,(+)     (-) substance abuse    Endo/Other  neg diabetes  Class 3 obesity  Renal/GU negative Renal ROS     Musculoskeletal   Abdominal  (+) + obese  Peds  Hematology   Anesthesia Other Findings Past Medical History: No date: Carpal tunnel syndrome, bilateral 03/2024: Ganglion cyst of wrist, left No date: GERD (gastroesophageal reflux disease) No date: Hyperlipidemia No date: Hypertension No date: Morbid obesity with BMI of 40.0-44.9, adult (HCC) No date: Osteoarthritis of both knees No date: Wears dentures     Comment:  full upper and lower  Reproductive/Obstetrics                              Anesthesia Physical Anesthesia Plan  ASA: 3  Anesthesia Plan: General   Post-op Pain Management: Ofirmev  IV (intra-op)* and Toradol  IV (intra-op)*   Induction: Intravenous  PONV Risk Score and Plan: 3 and Ondansetron  and Dexamethasone   Airway Management Planned: LMA  Additional Equipment: None  Intra-op Plan:   Post-operative Plan: Extubation in OR  Informed Consent: I have  reviewed the patients History and Physical, chart, labs and discussed the procedure including the risks, benefits and alternatives for the proposed anesthesia with the patient or authorized representative who has indicated his/her understanding and acceptance.     Dental advisory given  Plan Discussed with: CRNA and Surgeon  Anesthesia Plan Comments: (Discussed risks of anesthesia with patient, including PONV, sore throat, lip/dental/eye damage. Rare risks discussed as well, such as cardiorespiratory and neurological sequelae, and allergic reactions. Discussed the role of CRNA in patient's perioperative care. Patient understands.)         Anesthesia Quick Evaluation

## 2024-04-04 NOTE — Op Note (Signed)
 04/04/2024  1:41 PM  Patient:   Rose Roberts  Pre-Op Diagnosis:   Left volar carpal ganglion.  Post-Op Diagnosis:   Same.  Procedure:   Excision of left volar carpal ganglion.  Surgeon:   Lonnie Roberts, MD  Anesthesia:   General LMA  Findings:   As above.  Complications:   None  EBL:   0 cc  Fluids:   100 cc crystalloid  TT:   24 minutes at 250 mmHg  Drains:   None  Closure:   3-0 Vicryl subcuticular sutures  Brief Clinical Note:   The patient is a 73 year old female with a history of a slowly enlarging soft tissue mass along the volar radial aspect of her left wrist. Her symptoms have progressed despite medications, activity modification, etc. Her history and examination are consistent with a left volar carpal ganglion. The patient presents at this time for excision of the left volar carpal ganglion.  Procedure:   The patient was brought into the operating room and lain in the supine position.  After adequate general endotracheal intubation and anesthesia were obtained, the patient's left hand and upper extremity were prepped with ChloraPrep solution before being draped sterilely. Preoperative antibiotics were administered.  A timeout was performed to verify the appropriate surgical site before the limb was exsanguinated with an Esmarch and the tourniquet inflated to 250 mmHg.  An approximately 2-2.5 cm incision was made longitudinally centered over the cyst. The incision was carried down through the subcutaneous tissues with care taken to identify and protect any neurovascular structures. Specifically, the radial artery was identified and protected with retractors. Once the cyst was identified, it was dissected out circumferentially, tracking it down to the stalk, before it was removed. During its removal, the cyst ruptured, releasing approximately 0.5 cc of a straw-colored gelatinous material, consistent with a ganglion cyst. The cyst was tracked down to the carpus. A small  amount of capsule was removed from the carpus in order to minimize the likelihood of recurrence.  The wound was copiously irrigated with sterile saline solution before the skin was closed using 3-0 Vicryl subcuticular sutures. Benzoin and Steri-Strips were applied to the skin before a sterile bulky dressing was applied to the wrist. A total of 5 cc of 0.5% plain Sensorcaine  was injected in and around the incision to help with postoperative analgesia prior to placement of the dressing.  A Velcro volar wrist immobilizer was applied to the left wrist before the patient was awakened, extubated, and returned to the recovery room in satisfactory condition after tolerating the procedure well.

## 2024-04-04 NOTE — Anesthesia Postprocedure Evaluation (Signed)
 Anesthesia Post Note  Patient: Rose Roberts  Procedure(s) Performed: EXCISION, GANGLION CYST, WRIST (Left: Wrist)  Patient location during evaluation: PACU Anesthesia Type: General Level of consciousness: awake and alert Pain management: pain level controlled Vital Signs Assessment: post-procedure vital signs reviewed and stable Respiratory status: spontaneous breathing, nonlabored ventilation, respiratory function stable and patient connected to nasal cannula oxygen Cardiovascular status: blood pressure returned to baseline and stable Postop Assessment: no apparent nausea or vomiting Anesthetic complications: no   No notable events documented.   Last Vitals:  Vitals:   04/04/24 1415 04/04/24 1433  BP: (!) 164/82 (!) 164/74  Pulse: 79 78  Resp: (!) 9 18  Temp: (!) 36.1 C (!) 36.1 C  SpO2: 98% 100%    Last Pain:  Vitals:   04/04/24 1433  TempSrc: Temporal  PainSc: 0-No pain                 Lattie Poli

## 2024-04-04 NOTE — H&P (Signed)
 History of Present Illness: Rose Roberts is a 73 y.o. female who presents today for evaluation of a bump that has appeared along the radial, volar aspect of the left wrist. The patient states that she has noticed this bump over the past 2-3 months without any known trauma or injury. She does have a history of a cyst removal from the dorsal aspect of the wrist in addition to a carpal tunnel release in the past. She denies any pain in the area but does report increased swelling especially with increased activities. She is not experiencing any pain in the thumb. She denies any numbness or tingling to the left upper extremity. She denies any open wound or drainage from the area. Pain score today is 0-10. She denies any personal history of heart attack, stroke, asthma or COPD. No personal history of blood clots. She is not a diabetic.  Past Medical History: Hyperlipidemia  Hypertension   Past Surgical History: Right TKA using all cemented biomet vanguard system with a 65 mm PCR femur a 71 mm tibial tray with a 10 mm anterior stabilized e-poly insert and a 31 x 8 mm all-poly 3 pegged domed patella Right 11/23/2018 (Dr. Daun Epstein)  Left TKA using all-cemented biomet vanguard system with a 65 mm PCR femur,a 71 mm tibial tray with a 12 mm anterior stabilized e-poly insert and a 31 x 8 mm all-poly 3 pegged domed patella Left 06/19/2019 (Dr. Daun Epstein)  COMBINED HYSTEROSCOPY DIAGNOSTIC / D&C 09/22/2021  NEUROPLASTY / TRANSPOSITION MEDIAN NERVE AT CARPAL TUNNEL BILATERAL Bilateral  TUBAL LIGATION   Past Family History: No Known Problems Mother  No Known Problems Father  No Known Problems Sister   Medications: cyclobenzaprine (FLEXERIL) 5 MG tablet CYCLOBENZAPRINE HCL 5 MG TABS  losartan (COZAAR) 25 MG tablet  acetaminophen  (TYLENOL ) 650 MG ER tablet Take 650 mg by mouth as needed for Pain  aspirin  81 MG EC tablet Take 1 tablet (81 mg total) by mouth 2 (two) times daily 60 tablet 0  atorvastatin  (LIPITOR) 20  MG tablet 20 mg once daily  cholecalciferol 1000 unit tablet Take 1 tablet (1,000 Units total) by mouth once daily 90 tablet 1  gabapentin (NEURONTIN) 100 MG capsule Take 1 capsule (100 mg total) by mouth at bedtime for 60 days 60 capsule 0  hydroCHLOROthiazide  (MICROZIDE ) 12.5 mg capsule 12.5 mg once daily  metFORMIN (GLUCOPHAGE-XR) 500 MG XR tablet Take 500 mg by mouth 2 (two) times daily  predniSONE (DELTASONE) 10 MG tablet 5,5,4,4,3,3,2,2,1,1 tab po tapering dose 30 tablet 0  tiZANidine (ZANAFLEX) 2 MG tablet Take 2 mg by mouth as needed  triamcinolone 0.5 % ointment Apply topically as needed   Allergies: Lisinopril Unknown   Review of Systems:  A comprehensive 14 point ROS was performed, reviewed by me today, and the pertinent orthopaedic findings are documented in the HPI.  Physical Exam: BP (!) 152/80  Ht 157.5 cm (5' 2.01)  Wt 92.3 kg (203 lb 6.4 oz)  BMI 37.19 kg/m  General/Constitutional: The patient appears to be well-nourished, well-developed, and in no acute distress. Neuro/Psych: Normal mood and affect, oriented to person, place and time. Eyes: Non-icteric. Pupils are equal, round, and reactive to light, and exhibit synchronous movement. ENT: Unremarkable. Lymphatic: No palpable adenopathy. Respiratory: Lungs clear to auscultation, Normal chest excursion, No wheezes, and Non-labored breathing Cardiovascular: Regular rate and rhythm. No murmurs. and No edema, swelling or tenderness, except as noted in detailed exam. Integumentary: No impressive skin lesions present, except as noted in detailed  exam. Musculoskeletal: Unremarkable, except as noted in detailed exam.  The patient presents today as a cane for assistance with ambulation. Skin examination of the left wrist does reveal what appears to be a ganglion cyst along the volar, radial aspect of the wrist. Cyst appears to be 2 inches in diameter. Nontender palpation over the area. With range of motion of fingers there is  no movement of the cyst identified. No open wound, erythema or ecchymosis. She denies any pain with palpation over the [facility] joint. Denies any pain with palpation over the cyst. She is able to gently flex and extend all digits without discomfort. Negative grind test to the left thumb. The patient is intact light touch throughout the left upper extremity. Cap refill is intact to each individual digit. Radial and ulnar pulse are intact to the left wrist. The cyst does not appear or feel pulsatile.  Imaging: None.  Impression: Ganglion of left wrist.  Plan:  1. Treatment options were discussed today with the patient. 2. The patient does have a ganglion cyst along the volar, radial aspect of the wrist. 3. Discussed potential aspiration but the high chance of recurrence with aspiration. 4. After discussing the pros and cons of conservative versus aggressive treatment options the patient would like to proceed with a ganglion cyst removal to be performed by Dr. Daun Epstein in the future. 5. This document will serve as a surgical history and physical for the patient. 6. The patient will follow-up per standard postop protocol. They can call the clinic they have any questions, new symptoms develop or symptoms worsen.  The procedure was discussed with the patient, as were the potential risks (including bleeding, infection, nerve and/or blood vessel injury, persistent or recurrent pain, recurrence of the cyst, progression of arthritis, need for further surgery, blood clots, strokes, heart attacks and/or arhythmias, pneumonia, etc.) and benefits. The patient states her understanding and wishes to proceed.    H&P reviewed and patient re-examined. No changes.

## 2024-04-04 NOTE — Transfer of Care (Signed)
 Immediate Anesthesia Transfer of Care Note  Patient: Rose Roberts  Procedure(s) Performed: EXCISION, GANGLION CYST, WRIST (Left: Wrist)  Patient Location: PACU  Anesthesia Type:General  Level of Consciousness: drowsy  Airway & Oxygen Therapy: Patient Spontanous Breathing  Post-op Assessment: Report given to RN and Post -op Vital signs reviewed and stable  Post vital signs: Reviewed and stable  Last Vitals:  Vitals Value Taken Time  BP 138/75 04/04/24 13:40  Temp    Pulse 85 04/04/24 13:44  Resp 17 04/04/24 13:44  SpO2 97 % 04/04/24 13:44  Vitals shown include unfiled device data.  Last Pain:  Vitals:   04/04/24 1014  TempSrc: Temporal  PainSc: 0-No pain         Complications: No notable events documented.

## 2024-04-05 ENCOUNTER — Encounter: Payer: Self-pay | Admitting: Surgery

## 2024-04-05 LAB — SURGICAL PATHOLOGY

## 2024-05-08 ENCOUNTER — Encounter: Payer: Self-pay | Admitting: Ophthalmology

## 2024-05-08 NOTE — Anesthesia Preprocedure Evaluation (Signed)
 Anesthesia Evaluation  Patient identified by MRN, date of birth, ID band Patient awake    Reviewed: Allergy & Precautions, NPO status , Patient's Chart, lab work & pertinent test results  History of Anesthesia Complications Negative for: history of anesthetic complications  Airway Mallampati: III   Neck ROM: Full    Dental  (+) Implants Lower molar chipped:   Pulmonary sleep apnea and Continuous Positive Airway Pressure Ventilation , neg COPD, Patient abstained from smoking.Not current smoker   Pulmonary exam normal breath sounds clear to auscultation       Cardiovascular Exercise Tolerance: Good METShypertension, (-) CAD and (-) Past MI Normal cardiovascular exam(-) dysrhythmias  Rhythm:Regular Rate:Normal     Neuro/Psych negative neurological ROS  negative psych ROS   GI/Hepatic ,GERD  ,,(+)     (-) substance abuse    Endo/Other  diabetes, Type 2Hypothyroidism    Renal/GU Renal disease (nephrolithiasis)negative Renal ROS     Musculoskeletal  (+) Arthritis ,    Abdominal   Peds  Hematology negative hematology ROS (+)   Anesthesia Other Findings Past Medical History: No date: Arthritis No date: B12 deficiency No date: Bladder polyps No date: Diverticulitis No date: GERD (gastroesophageal reflux disease) No date: History of blood in urine No date: History of chickenpox No date: History of [place] measles No date: History of kidney stones No date: History of mumps No date: Hypertension No date: Hypothyroidism No date: IBS (irritable bowel syndrome) No date: LPRD (laryngopharyngeal reflux disease) No date: Sleep apnea     Comment:  CPAP  Reproductive/Obstetrics                              Anesthesia Physical Anesthesia Plan  ASA: 3  Anesthesia Plan: MAC   Post-op Pain Management: Minimal or no pain anticipated   Induction: Intravenous  PONV Risk Score and Plan: 2 and  TIVA, Midazolam and Treatment may vary due to age or medical condition  Airway Management Planned: Nasal Cannula  Additional Equipment:   Intra-op Plan:   Post-operative Plan:   Informed Consent: I have reviewed the patients History and Physical, chart, labs and discussed the procedure including the risks, benefits and alternatives for the proposed anesthesia with the patient or authorized representative who has indicated his/her understanding and acceptance.       Plan Discussed with: CRNA  Anesthesia Plan Comments: (Explained risks of anesthesia, including PONV, and rare emergencies such as cardiac events, respiratory problems, and allergic reactions, requiring invasive intervention. Discussed the role of CRNA in patient's perioperative care. Patient understands. )         Anesthesia Quick Evaluation

## 2024-05-08 NOTE — Discharge Instructions (Signed)
 El cuidado despu?s de una operaci?n de cataratas ?Cataract Surgery, Care After (Spanish) ? ?Esta hoja le da informaci?n sobre c?mo cuidarse despu?s de su cirug?a. Su oftalm?logo puede darle instrucciones m?s espec?ficas tambi?n. Si tiene problemas o preguntas, comun?quese con su doctor en [facility], [phone]. ? ??Qu? puedo esperar despu?s de la cirug?a? ?Es normal tener: ?Picaz?n ?Sensaci?n de tener un cuerpo extra?o (se siente como un grano de Investment banker, corporate ojo) ?Secreci?n acuosa (lagrimeo excesivo) ?Sensibilidad a la luz y al tacto ?Moretones en el ojo o a su alrededor ?Visi?n ligeramente borrosa ? ?Siga estas instrucciones en casa: ?No se toque ni se frote los ojos. ?Es posible que le digan que use una pantalla protectora o lentes de sol para proteger sus ojos. ?No se ponga un lente de contacto en el ojo operado hasta que su doctor lo apruebe. ?Mantenga los p?rpados y la cara limpios y secos. ?No deje que el agua le caiga directamente en la cara mientras se duche. ?Evite el jab?n y champ? en los ojos. ?No se maquille los ojos por [employer]. ? ?Rev?sese su ojo todos los d?as para ver si hay  signos de infecci?n. Est? atento(a): ?Enrojecimiento, hinchaz?n o dolor. ?L?quido, sangre o pus. ?Empeoramiento de la visi?n. ?Aumento de la sensibilidad a la luz o al tacto. ? ?Actividad: ?Physicist, medical d?a, evite agacharse y leer. Puede volver a leer y a agacharse al d?a siguiente. ?No maneje ni use maquinaria pesada durante al menos 24 horas. ?Evite las actividades vigorosas durante una semana. Est? bien Soil scientist, usar la cinta de correr, usar la bicicleta est?tica y [employer]. ?No levante objetos pesados (m?s de 20 libras) por una semana. ?No realice trabajos de jardiner?a ni tareas dom?sticas sucias en la casa (como limpiar los pisos, los ba?os, pasar la aspiradora, etc.) durante una semana. ?No nade ni use un jacuzzi durante 2 semanas.  ?Pregunte a su doctor cu?ndo  usted puede volver a trabajar. ? ?Instrucciones generales: ?Tome o aplique los medicamentos recetados y de venta libre seg?n le indique su doctor, incluyendo las gotas para los ojos y las pomadas. ?Contin?e tomando los medicamentos que fueron suspendidos antes de la cirug?a, a menos que su doctor le indique lo contrario. ?Vaya a todas sus citas de seguimiento que ya fueron programadas. ? ? ?P?ngase en contacto con un proveedor de atenci?n m?dica si: ?Le aparecen m?s moretones alrededor del ojo. ?Tiene dolor que no se Systems analyst. ?Tiene fiebre. ?Le sale l?quido, pus o sangre del ojo o de la incisi?n. ?Su sensibilidad a la Actor. ?Tiene manchas (moscas volantes) o destellos de luz en la vista. ?Tiene n?useas o v?mitos. ? ?Vaya al Departamento de Emergencias m?s cercana o llame al 911 si: ?Pierde la vista de forma repentina. ?Tiene un dolor de ojo que es intenso y que se est? empeorando. ?

## 2024-05-31 ENCOUNTER — Ambulatory Visit: Admission: RE | Admit: 2024-05-31 | Source: Home / Self Care | Admitting: Ophthalmology

## 2024-05-31 ENCOUNTER — Encounter: Payer: Self-pay | Admitting: Anesthesiology

## 2024-05-31 SURGERY — PHACOEMULSIFICATION, CATARACT, WITH IOL INSERTION
Anesthesia: Topical | Laterality: Left

## 2024-06-14 ENCOUNTER — Encounter: Admission: RE | Payer: Self-pay | Source: Home / Self Care

## 2024-06-14 ENCOUNTER — Ambulatory Visit: Admission: RE | Admit: 2024-06-14 | Source: Home / Self Care | Admitting: Ophthalmology

## 2024-06-14 SURGERY — PHACOEMULSIFICATION, CATARACT, WITH IOL INSERTION
Anesthesia: Topical | Laterality: Right
